# Patient Record
Sex: Female | Born: 1985 | Race: White | Hispanic: No | Marital: Married
Health system: Midwestern US, Community
[De-identification: ages and names within clinical notes are randomized; demographics above are authoritative.]

## PROBLEM LIST (undated history)

## (undated) DIAGNOSIS — I10 Essential (primary) hypertension: Secondary | ICD-10-CM

## (undated) DIAGNOSIS — R748 Abnormal levels of other serum enzymes: Secondary | ICD-10-CM

---

## 2012-03-30 LAB — HM PAP SMEAR

## 2012-08-08 NOTE — Progress Notes (Signed)
[facility]  Sadie Haber, M.D.  [address]  [place], [place] [address]  Office : [phone]  Fax : [phone]  08/08/2012    HISTORY OF PRESENT ILLNESS  Jodi Lamb is a 26 y.o. female.  CC:  Passing out  HPI:  New patient, accompanied by husband  Felt dizzy initially on the plane flight from [place] (returning from honeymoon) on 08/06/12.  Half way through the flight, was sleeping, her husband woke her up when the drink cart came by, she felt "bad" and had a syncopal episode, was out about 30 seconds.  She had one margarita and 1 glass of water prior to the flight (normally doesn't drink alcohol.  She's had syncopal episodes in the past, but this seemed a little different.  Last syncopal episode was 2007.  Since this latest episode, she's felt intermittent dizziness, nausea.  She's had headache for several months - since moving from [place]  Has had episodes where she felt like she couldn't breathe, "almost like having a panic attack".  She reports nothing to be stressed about (other than recent wedding)    Review of Systems   Constitutional: Positive for malaise/fatigue. Negative for fever and chills.   HENT: Negative for sore throat and tinnitus.    Eyes: Negative for blurred vision and double vision.   Respiratory: Positive for shortness of breath. Negative for cough.    Cardiovascular: Positive for palpitations. Negative for chest pain.   Gastrointestinal: Positive for nausea and diarrhea (loose stools). Negative for vomiting.   Genitourinary: Negative for hematuria. Frequency: yesterday - did drink a lot of water.   Musculoskeletal: Positive for myalgias (back of arms bilaterally). Negative for joint pain.   Skin: Positive for itching (back of neck).   Neurological: Positive for dizziness.   Endo/Heme/Allergies: Bruises/bleeds easily.   Psychiatric/Behavioral: Negative for depression.         No Known Allergies    History reviewed. No pertinent past medical history.    Past Surgical  History   Procedure Laterality Date   ??? Hx heent       oral - teeth        History     Social History   ??? Marital Status: MARRIED     Spouse Name: N/A     Number of Children: N/A   ??? Years of Education: N/A     Occupational History   ??? video journalist      [employer]     Social History Main Topics   ??? Smoking status: Never Smoker    ??? Smokeless tobacco: Never Used   ??? Alcohol Use: Yes      Comment: rare   ??? Drug Use: Not on file   ??? Sexually Active: Not on file     Other Topics Concern   ??? Not on file     Social History Narrative   ??? No narrative on file       Family Status   Relation Status Death Age   ??? Mother Alive    ??? Father Alive      Family History   Problem Relation Age of Onset   ??? Other Mother      fibromyalgia   ??? Diabetes Maternal Aunt    ??? Diabetes Maternal Uncle    ??? Diabetes Maternal Grandmother    ??? Cancer Paternal Grandmother      breast   ??? Diabetes Paternal Grandfather    ??? Hypertension Paternal Actor  Visit Vitals   Item Reading   ??? BP 112/62   ??? Pulse 72   ??? Temp(Src) 98.1 ??F (36.7 ??C) (Oral)   ??? Ht 5' 7.75" (1.721 m)   ??? Wt 256 lb (116.121 kg)   ??? BMI 39.21 kg/m2           Physical Exam   Constitutional: She appears well-developed and well-nourished.   HENT:   Right Ear: Hearing and tympanic membrane normal.   Left Ear: Hearing and tympanic membrane normal.   Nose: Nose normal.   Mouth/Throat: Oropharynx is clear and moist and mucous membranes are normal.   Eyes: EOM are normal. Pupils are equal, round, and reactive to light.   Neck: Neck supple. No mass and no thyromegaly present.   Cardiovascular: Normal rate, regular rhythm and normal heart sounds.    Pulses:       Radial pulses are 2+ on the right side, and 2+ on the left side.        Posterior tibial pulses are 2+ on the right side, and 2+ on the left side.   Peripheral - no edema    Pulmonary/Chest: Effort normal and breath sounds normal.   Abdominal: Soft. Bowel sounds are normal. There is no hepatosplenomegaly. There is no  tenderness.   Musculoskeletal:        Thoracic back: She exhibits no tenderness and no bony tenderness.        Lumbar back: She exhibits no tenderness and no bony tenderness.   GAIT - normal   No joint swelling in RUE, LUE, RLE, or LLE   Lymphadenopathy:     She has no cervical adenopathy.        Right: No supraclavicular adenopathy present.        Left: No supraclavicular adenopathy present.   Neurological: She is alert. No cranial nerve deficit. Gait normal.   Reflex Scores:       Bicep reflexes are 1+ on the right side and 1+ on the left side.       Brachioradialis reflexes are 1+ on the right side and 1+ on the left side.       Patellar reflexes are 2+ on the right side and 2+ on the left side.  Skin: Skin is warm and dry.   Psychiatric: She has a normal mood and affect. Her speech is normal and behavior is normal.        ASSESSMENT and PLAN  #1 Syncope 780.2. New problem, stable.  EKG shows normal sinus rhythm, rate 65.  Normal axis.  Normal PR and QTc intervals.  May have been vagal/anxiety/relative dehydration from alcohol and lack of fluids.  Check CMP, CBC  #2 - Fatigue 780.79. New problem, stable.  Checking CMP, CBC, sed rate, TSH  #3 - Dizziness 780.4. New problem, stable.  She declines rx for Antivert  Follow up pending lab results, consider SSRI like Sertraline if labs are ok

## 2012-08-09 ENCOUNTER — Encounter

## 2012-08-09 LAB — CBC WITH AUTOMATED DIFF
ABS. BASOPHILS: 0 10*3/uL (ref 0.0–0.2)
ABS. EOSINOPHILS: 0.2 10*3/uL (ref 0.0–0.4)
ABS. IMM. GRANS.: 0 10*3/uL (ref 0.0–0.1)
ABS. MONOCYTES: 0.8 10*3/uL (ref 0.1–0.9)
ABS. NEUTROPHILS: 6.1 10*3/uL (ref 1.4–7.0)
Abs Lymphocytes: 2.8 10*3/uL (ref 0.7–3.1)
BASOPHILS: 0 % (ref 0–3)
EOSINOPHILS: 2 % (ref 0–5)
HCT: 41.6 % (ref 34.0–46.6)
HGB: 13.3 g/dL (ref 11.1–15.9)
IMMATURE GRANULOCYTES: 0 % (ref 0–2)
Lymphocytes: 28 % (ref 14–46)
MCH: 27.8 pg (ref 26.6–33.0)
MCHC: 32 g/dL (ref 31.5–35.7)
MCV: 87 fL (ref 79–97)
MONOCYTES: 8 % (ref 4–12)
NEUTROPHILS: 62 % (ref 40–74)
PLATELET: 328 10*3/uL (ref 155–379)
RBC: 4.79 x10E6/uL (ref 3.77–5.28)
RDW: 13.9 % (ref 12.3–15.4)
WBC: 10 10*3/uL (ref 3.4–10.8)

## 2012-08-09 LAB — METABOLIC PANEL, COMPREHENSIVE
A-G Ratio: 1.9 (ref 1.1–2.5)
ALT (SGPT): 63 IU/L — ABNORMAL HIGH (ref 0–32)
AST (SGOT): 43 IU/L — ABNORMAL HIGH (ref 0–40)
Albumin: 4.5 g/dL (ref 3.5–5.5)
Alk. phosphatase: 65 IU/L (ref 42–107)
BUN/Creatinine ratio: 24 — ABNORMAL HIGH (ref 8–20)
BUN: 20 mg/dL (ref 6–20)
Bilirubin, total: 0.3 mg/dL (ref 0.0–1.2)
CO2: 22 mmol/L (ref 19–28)
Calcium: 9.5 mg/dL (ref 8.7–10.2)
Chloride: 102 mmol/L (ref 97–108)
Creatinine: 0.84 mg/dL (ref 0.57–1.00)
GFR est AA: 111 mL/min/{1.73_m2} (ref >59)
GFR est non-AA: 96 mL/min/{1.73_m2} (ref >59)
GLOBULIN, TOTAL: 2.4 g/dL (ref 1.5–4.5)
Glucose: 84 mg/dL (ref 65–99)
Potassium: 4.9 mmol/L (ref 3.5–5.2)
Protein, total: 6.9 g/dL (ref 6.0–8.5)
Sodium: 142 mmol/L (ref 134–144)

## 2012-08-09 LAB — TSH 3RD GENERATION: TSH: 2.23 u[IU]/mL (ref 0.450–4.500)

## 2012-08-09 LAB — SED RATE (ESR): Sed rate (ESR): 6 mm/hr (ref 0–32)

## 2012-08-10 LAB — SPECIMEN STATUS REPORT

## 2012-08-11 ENCOUNTER — Encounter

## 2012-08-11 LAB — HEPATITIS C ANTIBODY: HCV Ab: <0.1 s/co ratio (ref 0.0–0.9)

## 2012-08-11 LAB — HEPATITIS C AB: HEP C VIRUS AB: <0.1 s/co ratio (ref 0.0–0.9)

## 2012-08-11 LAB — HEP B SURFACE AG: Hep B surface Ag screen: NEGATIVE

## 2012-08-11 NOTE — Progress Notes (Signed)
Quick Note:    Patient notified of abnormal results and instruction given.  ______

## 2012-08-11 NOTE — Progress Notes (Signed)
Quick Note:    Thyroid (TSH) is ok  Metabolic panel - sugar (glucose), kidney, and electrolytes ok. 2 liver tests (AST and ALT) were both mildly elevated. Because of that, Hepatitis B and C tests were done, which were negative.  Sed rate (for inflammation) is ok  CBC (blood count) is ok    Elevated liver tests may have been incidental or related to recent alcohol. Recommend making a lab appointment to repeat liver tests in 2 weeks    ______

## 2012-08-30 NOTE — Progress Notes (Signed)
[facility]  Sadie Haber, M.D.  [address]  [place], [place] [address]  Office : [phone]  Fax : [phone]  08/30/2012    HISTORY OF PRESENT ILLNESS  Jodi Lamb is a 26 y.o. female.  CC:  Recheck on fainting spell  HPI:  In for a recheck.  She has not had any further syncopal episodes since her last visit here on 08/08/12,  however she has episodes where she feels she could pass out.  She reports previous syncopal episodes (between ages 62 and 34) were all related to getting dehydrated/not drinking enough fluids.  Syncopal episode in 2007 was when a boyfriend told her he got another girl pregnant.  This last syncopal episode occurred while flying back from her honeymoon (see last office visit).  Last week she had feeling she could pass out while [place].  In the last 6 months she has had episodes where she feels she may pass out.  She can feel that sensation coming on and she has about a minute to steady herself.  Other than the episode on the plane trip, she hasn't actually had a syncopal episode.  She does feel a lot of stress with her job and recently having gotten married.  She is also in to recheck liver tests - AST/ALT were mildly elevated on lab work last visit    Review of Systems   Respiratory: Negative for shortness of breath.    Cardiovascular: Negative for palpitations.   Gastrointestinal: Negative for nausea and abdominal pain.        No jaundice         No Known Allergies        Past Surgical History   Procedure Laterality Date   ??? Hx heent       oral - teeth        History     Social History   ??? Marital Status: MARRIED     Spouse Name: N/A     Number of Children: N/A   ??? Years of Education: N/A     Occupational History   ??? video journalist      [employer]     Social History Main Topics   ??? Smoking status: Never Smoker    ??? Smokeless tobacco: Never Used   ??? Alcohol Use: Yes      Comment: rare   ??? Drug Use: Not on file   ??? Sexually Active: Not on file     Other Topics  Concern   ??? Not on file     Social History Narrative   ??? No narrative on file       Family Status   Relation Status Death Age   ??? Mother Alive    ??? Father Alive      Family History   Problem Relation Age of Onset   ??? Other Mother      fibromyalgia   ??? Diabetes Maternal Aunt    ??? Diabetes Maternal Uncle    ??? Diabetes Maternal Grandmother    ??? Cancer Paternal Grandmother      breast   ??? Diabetes Paternal Grandfather    ??? Hypertension Paternal Grandfather        Visit Vitals   Item Reading   ??? BP 130/78   ??? Pulse 84   ??? Temp(Src) 98.1 ??F (36.7 ??C) (Oral)   ??? Ht 5' 7.75" (1.721 m)   ??? Wt 254 lb (115.214 kg)   ???  BMI 38.9 kg/m2           Physical Exam   Constitutional: She appears well-developed and well-nourished.   Neck: Neck supple. Carotid bruit is not present. No mass and no thyromegaly present.   Cardiovascular: Normal rate, regular rhythm and normal heart sounds.    Pulses:       Carotid pulses are 2+ on the right side, and 2+ on the left side.       Posterior tibial pulses are 2+ on the right side, and 2+ on the left side.   Peripheral - no edema    Pulmonary/Chest: Breath sounds normal.   Abdominal: Soft. Bowel sounds are normal. There is no hepatosplenomegaly. There is no tenderness.   Neurological: She is alert. Gait normal.   Reflex Scores:       Bicep reflexes are 1+ on the right side and 1+ on the left side.       Brachioradialis reflexes are 1+ on the right side and 1+ on the left side.       Patellar reflexes are 2+ on the right side and 2+ on the left side.  Psychiatric: She has a normal mood and affect. Her speech is normal and behavior is normal.       ASSESSMENT and PLAN  #1 - Syncope 780.2.Est problem, stable. No further episodes but some near-syncope.  TSH, CMP, CBC, EKG all ok from last time (except mild increase in AST/ALT).  Could be related to anxiety/stress, we discussed SSRI/serotonin  #2 - Elevated liver enzymes 790.4. New problem, stable. Hep B/C were negative.  Will repeat liver tests - if  still elevated, would proceed with ultrasound  Follow up pending lab results.  Did give rx for Sertraline 50mg  daily - may consider starting that, half tablet daily, if liver tests are ok

## 2012-08-31 LAB — HEPATIC FUNCTION PANEL
ALT (SGPT): 19 IU/L (ref 0–32)
AST (SGOT): 17 IU/L (ref 0–40)
Albumin: 4.7 g/dL (ref 3.5–5.5)
Alk. phosphatase: 63 IU/L (ref 39–117)
Bilirubin, direct: 0.09 mg/dL (ref 0.00–0.40)
Bilirubin, total: 0.2 mg/dL (ref 0.0–1.2)
Protein, total: 7.4 g/dL (ref 6.0–8.5)

## 2012-09-01 NOTE — Progress Notes (Signed)
Quick Note:    Patient notifed of normal results.  ______

## 2012-09-01 NOTE — Progress Notes (Signed)
Quick Note:    Liver tests are normal     May want to try Sertraline - take just half a tablet daily. If you start it, then recheck here in 1 month.    ______

## 2013-08-17 NOTE — Telephone Encounter (Signed)
Pt has annual appt w/ [facility] on 12/3. Refills on her Natazia sent to [facility].   (pt has a new nameJohanne Lamb)

## 2013-09-13 LAB — AMB POC HEMOGLOBIN (HGB): Hemoglobin (POC): 14.5

## 2013-09-13 NOTE — Progress Notes (Addendum)
Name: Jodi Lamb  Date: 09/13/2013  Date of Birth: January 22, 1986  LMP: Patient's last menstrual period was 08/30/2013.      Mammogram/Pap Hx 09/13/2013   Pap Date 03/30/2012   Pap Result WNL       Jodi Lamb is a 27 y.o. G0  who is here for an annual exam, no complaints    Review of Systems   Constitutional: Negative for fever, chills, weight loss, malaise/fatigue and diaphoresis.   HENT: Negative for nosebleeds.    Eyes: Negative for blurred vision.   Cardiovascular: Negative for chest pain, palpitations and leg swelling.   Gastrointestinal: Negative for nausea, vomiting, abdominal pain, diarrhea, constipation, blood in stool and melena.   Genitourinary: Negative for dysuria, urgency, frequency and hematuria.   Skin: Negative for itching and rash.   Neurological: Negative for dizziness, speech change, weakness and headaches.   Endo/Heme/Allergies: Bruises/bleeds easily.   Psychiatric/Behavioral: Negative for depression, suicidal ideas, hallucinations, memory loss and substance abuse. The patient is not nervous/anxious and does not have insomnia.    Pt c/o weight gain and bleeding gums      GYN HISTORY 09/13/2013   Current form of contraception OCP's   Previous forms of contracpetion Condoms   Menarche 10   Period cycle 28 - 30   Period duration in days 7   Period pattern Regular   Menstrual flow Moderate   Period control Panty liner;Thin pad;Tampon regular;Tampon super   Frequency of change for flow control 3 x d   Dysmenorrhea None   Dyspareunia None   Post-coital bleeding Small   Pap Date 03/30/2012   Pap Results WNL   STD History HPV       Past Medical History:  has a past medical history of Elevated liver enzymes (Oct 2013); Anxiety; and 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    Past Surgical History:  has past surgical history that includes heent.    Ob History:none    Sexual History:  reports that she currently engages in sexual activity. She reports using the following method of birth control/protection: Pill.     Family History: family history includes Breast Cancer in her maternal grandmother; Diabetes in her maternal aunt, maternal grandmother, maternal uncle, and paternal grandfather; Hypertension in her paternal grandfather; and Other in her mother.  There is no history of Colon Cancer and Ovarian Cancer.    Social History:  reports that she has never smoked. She has never used smokeless tobacco. She reports that  drinks alcohol. She reports that she does not use illicit drugs.    Current Outpatient Prescriptions   Medication Sig   ??? Estradiol Valerate-Dienogest (NATAZIA) 3 mg/2 mg-2 mg/ 2 mg-3 mg/1 mg tab Take 1 tablet by mouth daily.   ??? sertraline (ZOLOFT) 50 mg tablet Take 1 Tab by mouth daily.         Vitals:  height is 5' 7.25" (1.708 m) and weight is 290 lb (131.543 kg). Her blood pressure is 150/100.  Body mass index is 45.09 kg/(m^2).  Hgb: 14.5    Physical Exam   Constitutional: She is oriented to person, place, and time. She appears well-developed and well-nourished.   Eyes: No scleral icterus.   Neck: No tracheal deviation present. No thyromegaly present.   Pulmonary/Chest: Effort normal.   Breast without tenderness, masses, or discharge   Abdominal: Soft. She exhibits no distension and no mass. There is no splenomegaly or hepatomegaly. There is no tenderness. There is no rebound and no guarding.  Genitourinary: No vaginal discharge found.   External genitalia with in normal limits including urethral meatus.  Vagina pink and rugated.  Bimanual exam reveals the bladder to have good support and be in the proper position.  Cervix is present and without lesions. Uterus is present, small and smooth.  Adnexa present and within normal limits.   Lymphadenopathy:     She has no cervical adenopathy.     She has no axillary adenopathy.   Neurological: She is alert and oriented to person, place, and time.         Assessment: 27 y.o. , G0 P0000, here for annual exam  1. Health maintenance: pap smear is preformed, self  breast exam encouraged, mammogram is not ordered.  2Pregnancy Prevention: cont ocp.  May consider kids at 30  3. Htn: recheck over next 2 weeks then appt    Roney Marion, MD

## 2013-09-19 LAB — PAP, IG, RFX HPV ASCUS (507301)
.: 0
LABCORP 019018: 0

## 2013-09-28 NOTE — Progress Notes (Signed)
Name: Jodi Lamb  Date: 09/28/2013  Date of Birth: 26-Aug-1986  LMP: Patient's last menstrual period was 08/28/2013.    Mammogram/Pap Hx 09/28/2013 09/13/2013   Pap Date 09/13/2013 03/30/2012   Pap Result WNL WNL       Jodi Lamb is a 27 y.o. G0 P0000 who is here for had a chief complaint of Follow-up. BP check. When she left last time she gott serious, hired a Psychologist, educational and is commitetd to getting in shape     Review of Systems   Constitutional: Negative for fever, chills, weight loss, malaise/fatigue and diaphoresis.   HENT: Negative for nosebleeds.    Eyes: Negative for blurred vision.   Cardiovascular: Negative for chest pain, palpitations and leg swelling.   Gastrointestinal: Negative for nausea, vomiting, abdominal pain, diarrhea, constipation, blood in stool and melena.   Genitourinary: Negative for dysuria, urgency, frequency and hematuria.   Skin: Negative for itching and rash.   Neurological: Negative for dizziness, speech change, weakness and headaches.   Endo/Heme/Allergies: Does not bruise/bleed easily.   Psychiatric/Behavioral: Negative for depression, suicidal ideas, hallucinations, memory loss and substance abuse. The patient is not nervous/anxious and does not have insomnia.           GYN HISTORY 09/28/2013 09/13/2013   Current form of contraception - OCP's   Previous forms of contracpetion - Condoms   Menarche - 10   Period cycle - 28 - 30   Period duration in days - 7   Period pattern - Regular   Menstrual flow - Moderate   Period control - Panty liner;Thin pad;Tampon regular;Tampon super   Frequency of change for flow control - 3 x d   Dysmenorrhea - None   Dyspareunia - None   Post-coital bleeding - Small   Pap Date 09/13/2013 03/30/2012   Pap Results WNL WNL   STD History - HPV         Past Medical History:  has a past medical history of Elevated liver enzymes (Oct 2013); Anxiety; and 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    Past Surgical History:  has past surgical history that includes heent.    OB  History:none    Sexual History:  reports that she currently engages in sexual activity. She reports using the following method of birth control/protection: Pill.    Family History: family history includes Breast Cancer in her maternal grandmother; Diabetes in her maternal aunt, maternal grandmother, maternal uncle, and paternal grandfather; Hypertension in her paternal grandfather; and Other in her mother.  There is no history of Colon Cancer and Ovarian Cancer.    Social History:  reports that she has never smoked. She has never used smokeless tobacco. She reports that  drinks alcohol. She reports that she does not use illicit drugs.    Current Outpatient Prescriptions   Medication Sig   ??? Estradiol Valerate-Dienogest (NATAZIA) 3 mg/2 mg-2 mg/ 2 mg-3 mg/1 mg tab Take 1 tablet by mouth daily.   ??? sertraline (ZOLOFT) 50 mg tablet Take 1 Tab by mouth daily.       Allergies: has No Known Allergies.    Vital signs:  weight is 268 lb (121.564 kg). Her blood pressure is 118/86.  Body mass index is 41.67 kg/(m^2).     Physical Exam   Constitutional: She is oriented to person, place, and time. She appears well-developed and well-nourished.   HENT:   Head: Normocephalic and atraumatic.   Eyes: No scleral icterus.   Pulmonary/Chest: Effort normal.  Neurological: She is alert and oriented to person, place, and time.   Psychiatric: Judgment and thought content normal.         Assessment: 27 y.o. , G0 P0000, here for: bp check, she brought in 2 weeks worth of bps.  They run 130s/80s.  Will cont ocp. She will cont health improvement                  Roney Marion, MD

## 2014-02-14 MED ORDER — NORGESTIMATE-ETHINYL ESTRADIOL 0.25 MG-35 MCG TAB
PACK | Freq: Every day | ORAL | Status: DC
Start: 2014-02-14 — End: 2014-09-18

## 2014-02-14 NOTE — Telephone Encounter (Signed)
Pt is currently on Natazia ocp's. C/o menses lasting 10 days q month. Requests to change pills. Per [facility], okay to change to Orthocyclen.

## 2014-09-19 MED ORDER — MONONESSA (28) 0.25 MG-35 MCG TABLET
ORAL_TABLET | ORAL | Status: AC
Start: 2014-09-19 — End: ?

## 2015-07-10 ENCOUNTER — Encounter: Payer: Self-pay | Admitting: Pediatrics

## 2015-07-10 ENCOUNTER — Ambulatory Visit (INDEPENDENT_AMBULATORY_CARE_PROVIDER_SITE_OTHER): Payer: 59 | Admitting: Pediatrics

## 2015-07-10 VITALS — BP 149/104 | HR 93 | Temp 98.0°F | Ht 68.0 in | Wt 289.2 lb

## 2015-07-10 DIAGNOSIS — R635 Abnormal weight gain: Secondary | ICD-10-CM

## 2015-07-10 DIAGNOSIS — M7989 Other specified soft tissue disorders: Secondary | ICD-10-CM | POA: Diagnosis not present

## 2015-07-10 DIAGNOSIS — Z6841 Body Mass Index (BMI) 40.0 and over, adult: Secondary | ICD-10-CM

## 2015-07-10 DIAGNOSIS — R55 Syncope and collapse: Secondary | ICD-10-CM | POA: Diagnosis not present

## 2015-07-10 DIAGNOSIS — R030 Elevated blood-pressure reading, without diagnosis of hypertension: Secondary | ICD-10-CM

## 2015-07-10 DIAGNOSIS — IMO0001 Reserved for inherently not codable concepts without codable children: Secondary | ICD-10-CM | POA: Insufficient documentation

## 2015-07-10 LAB — POCT URINALYSIS DIPSTICK
BILIRUBIN UA: NEGATIVE
GLUCOSE UA: NEGATIVE
Ketones, UA: NEGATIVE
Nitrite, UA: NEGATIVE
PH UA: 5
Protein, UA: NEGATIVE
SPEC GRAV UA: 1.015
UROBILINOGEN UA: NEGATIVE

## 2015-07-10 NOTE — Patient Instructions (Signed)
Vasovagal Syncope, Adult °Syncope, commonly known as fainting, is a temporary loss of consciousness. It occurs when the blood flow to the brain is reduced. Vasovagal syncope (also called neurocardiogenic syncope) is a fainting spell in which the blood flow to the brain is reduced because of a sudden drop in heart rate and blood pressure. Vasovagal syncope occurs when the brain and the cardiovascular system (blood vessels) do not adequately communicate and respond to each other. This is the most common cause of fainting. It often occurs in response to fear or some other type of emotional or physical stress. The body has a reaction in which the heart starts beating too slowly or the blood vessels expand, reducing blood pressure. This type of fainting spell is generally considered harmless. However, injuries can occur if a person takes a sudden fall during a fainting spell.  °CAUSES  °Vasovagal syncope occurs when a person's blood pressure and heart rate decrease suddenly, usually in response to a trigger. Many things and situations can trigger an episode. Some of these include:  °· Pain.   °· Fear.   °· The sight of blood or medical procedures, such as blood being drawn from a vein.   °· Common activities, such as coughing, swallowing, stretching, or going to the bathroom.   °· Emotional stress.   °· Prolonged standing, especially in a warm environment.   °· Lack of sleep or rest.   °· Prolonged lack of food.   °· Prolonged lack of fluids.   °· Recent illness. °· The use of certain drugs that affect blood pressure, such as cocaine, alcohol, marijuana, inhalants, and opiates.   °SYMPTOMS  °Before the fainting episode, you may:  °· Feel dizzy or light headed.   °· Become pale. °· Sense that you are going to faint.   °· Feel like the room is spinning.   °· Have tunnel vision, only seeing directly in front of you.   °· Feel sick to your stomach (nauseous).   °· See spots or slowly lose vision.   °· Hear ringing in your  ears.   °· Have a headache.   °· Feel warm and sweaty.   °· Feel a sensation of pins and needles. °During the fainting spell, you will generally be unconscious for no longer than a couple minutes before waking up and returning to normal. If you get up too quickly before your body can recover, you may faint again. Some twitching or jerky movements may occur during the fainting spell.  °DIAGNOSIS  °Your caregiver will ask about your symptoms, take a medical history, and perform a physical exam. Various tests may be done to rule out other causes of fainting. These may include blood tests and tests to check the heart, such as electrocardiography, echocardiography, and possibly an electrophysiology study. When other causes have been ruled out, a test may be done to check the body's response to changes in position (tilt table test). °TREATMENT  °Most cases of vasovagal syncope do not require treatment. Your caregiver may recommend ways to avoid fainting triggers and may provide home strategies for preventing fainting. If you must be exposed to a possible trigger, you can drink additional fluids to help reduce your chances of having an episode of vasovagal syncope. If you have warning signs of an oncoming episode, you can respond by positioning yourself favorably (lying down). °If your fainting spells continue, you may be given medicines to prevent fainting. Some medicines may help make you more resistant to repeated episodes of vasovagal syncope. Special exercises or compression stockings may be recommended. In rare cases, the surgical placement   of a pacemaker is considered. °HOME CARE INSTRUCTIONS  °· Learn to identify the warning signs of vasovagal syncope.   °· Sit or lie down at the first warning sign of a fainting spell. If sitting, put your head down between your legs. If you lie down, swing your legs up in the air to increase blood flow to the brain.   °· Avoid hot tubs and saunas. °· Avoid prolonged  standing. °· Drink enough fluids to keep your urine clear or pale yellow. Avoid caffeine. °· Increase salt in your diet as directed by your caregiver.   °· If you have to stand for a long time, perform movements such as:   °¨ Crossing your legs.   °¨ Flexing and stretching your leg muscles.   °¨ Squatting.   °¨ Moving your legs.   °¨ Bending over.   °· Only take over-the-counter or prescription medicines as directed by your caregiver. Do not suddenly stop any medicines without asking your caregiver first.  °SEEK MEDICAL CARE IF:  °· Your fainting spells continue or happen more frequently in spite of treatment.   °· You lose consciousness for more than a couple minutes. °· You have fainting spells during or after exercising or after being startled.   °· You have new symptoms that occur with the fainting spells, such as:   °¨ Shortness of breath. °¨ Chest pain.   °¨ Irregular heartbeat.   °· You have episodes of twitching or jerky movements that last longer than a few seconds. °· You have episodes of twitching or jerky movements without obvious fainting. °SEEK IMMEDIATE MEDICAL CARE IF:  °· You have injuries or bleeding after a fainting spell.   °· You have episodes of twitching or jerky movements that last longer than 5 minutes.   °· You have more than one spell of twitching or jerky movements before returning to consciousness after fainting. °MAKE SURE YOU:  °· Understand these instructions. °· Will watch your condition. °· Will get help right away if you are not doing well or get worse. °Document Released: 09/14/2012 Document Reviewed: 09/14/2012 °[employer]® Patient Information ©2015 [employer]. This information is not intended to replace advice given to you by your health care provider. Make sure you discuss any questions you have with your health care provider. ° °
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
## 2015-07-11 LAB — CBC
HEMOGLOBIN: 13.4 g/dL (ref 11.1–15.9)
Hematocrit: 40.5 % (ref 34.0–46.6)
MCH: 27.5 pg (ref 26.6–33.0)
MCHC: 33.1 g/dL (ref 31.5–35.7)
MCV: 83 fL (ref 79–97)
PLATELETS: 382 10*3/uL — AB (ref 150–379)
RBC: 4.87 x10E6/uL (ref 3.77–5.28)
RDW: 14.4 % (ref 12.3–15.4)
WBC: 11.2 10*3/uL — AB (ref 3.4–10.8)

## 2015-07-11 LAB — BMP8+EGFR
BUN/Creatinine Ratio: 20 (ref 8–20)
BUN: 15 mg/dL (ref 6–20)
CALCIUM: 9.8 mg/dL (ref 8.7–10.2)
CHLORIDE: 98 mmol/L (ref 97–108)
CO2: 22 mmol/L (ref 18–29)
Creatinine, Ser: 0.76 mg/dL (ref 0.57–1.00)
GFR calc Af Amer: 123 mL/min/{1.73_m2} (ref >59)
GFR, EST NON AFRICAN AMERICAN: 106 mL/min/{1.73_m2} (ref >59)
GLUCOSE: 93 mg/dL (ref 65–99)
POTASSIUM: 4.8 mmol/L (ref 3.5–5.2)
Sodium: 139 mmol/L (ref 134–144)

## 2015-07-11 LAB — LIPID PANEL
CHOLESTEROL TOTAL: 162 mg/dL (ref 100–199)
Chol/HDL Ratio: 2.6 ratio units (ref 0.0–4.4)
HDL: 62 mg/dL (ref >39)
LDL CALC: 86 mg/dL (ref 0–99)
TRIGLYCERIDES: 71 mg/dL (ref 0–149)
VLDL CHOLESTEROL CAL: 14 mg/dL (ref 5–40)

## 2015-07-11 LAB — TSH: TSH: 1.78 u[IU]/mL (ref 0.450–4.500)

## 2015-07-31 ENCOUNTER — Ambulatory Visit: Payer: 59 | Admitting: Pediatrics

## 2015-09-16 ENCOUNTER — Telehealth: Payer: Self-pay | Admitting: Pediatrics

## 2015-09-16 NOTE — Telephone Encounter (Signed)
Does not want flu shot °

## 2015-10-03 ENCOUNTER — Encounter (HOSPITAL_COMMUNITY): Payer: Self-pay | Admitting: Emergency Medicine

## 2015-10-03 ENCOUNTER — Emergency Department (HOSPITAL_COMMUNITY): Payer: 59

## 2015-10-03 ENCOUNTER — Emergency Department (HOSPITAL_COMMUNITY)
Admission: EM | Admit: 2015-10-03 | Discharge: 2015-10-04 | Disposition: A | Discharge status: Home / Self Care | Payer: 59 | Attending: Emergency Medicine | Admitting: Emergency Medicine

## 2015-10-03 DIAGNOSIS — S79912A Unspecified injury of left hip, initial encounter: Secondary | ICD-10-CM | POA: Diagnosis not present

## 2015-10-03 DIAGNOSIS — Y9289 Other specified places as the place of occurrence of the external cause: Secondary | ICD-10-CM | POA: Diagnosis not present

## 2015-10-03 DIAGNOSIS — S199XXA Unspecified injury of neck, initial encounter: Secondary | ICD-10-CM | POA: Diagnosis present

## 2015-10-03 DIAGNOSIS — Y9389 Activity, other specified: Secondary | ICD-10-CM | POA: Insufficient documentation

## 2015-10-03 DIAGNOSIS — T07XXXA Unspecified multiple injuries, initial encounter: Secondary | ICD-10-CM

## 2015-10-03 DIAGNOSIS — S60512A Abrasion of left hand, initial encounter: Secondary | ICD-10-CM | POA: Diagnosis not present

## 2015-10-03 DIAGNOSIS — S3992XA Unspecified injury of lower back, initial encounter: Secondary | ICD-10-CM | POA: Diagnosis not present

## 2015-10-03 DIAGNOSIS — T148 Other injury of unspecified body region: Secondary | ICD-10-CM | POA: Diagnosis not present

## 2015-10-03 DIAGNOSIS — Y998 Other external cause status: Secondary | ICD-10-CM | POA: Diagnosis not present

## 2015-10-03 DIAGNOSIS — S60511A Abrasion of right hand, initial encounter: Secondary | ICD-10-CM | POA: Insufficient documentation

## 2015-10-03 LAB — I-STAT CHEM 8, ED
BUN: 17 mg/dL (ref 6–20)
CALCIUM ION: 1.14 mmol/L (ref 1.12–1.23)
Chloride: 104 mmol/L (ref 101–111)
Creatinine, Ser: 0.7 mg/dL (ref 0.44–1.00)
GLUCOSE: 145 mg/dL — AB (ref 65–99)
HCT: 41 % (ref 36.0–46.0)
HEMOGLOBIN: 13.9 g/dL (ref 12.0–15.0)
Potassium: 3.9 mmol/L (ref 3.5–5.1)
Sodium: 140 mmol/L (ref 135–145)
TCO2: 25 mmol/L (ref 0–100)

## 2015-10-03 LAB — I-STAT BETA HCG BLOOD, ED (MC, WL, AP ONLY)

## 2015-10-03 MED ORDER — FENTANYL CITRATE (PF) 100 MCG/2ML IJ SOLN
50.0000 ug | Freq: Once | INTRAMUSCULAR | Status: AC
  Administered 2015-10-03: 50 ug via INTRAVENOUS
  Filled 2015-10-03: qty 2

## 2015-10-03 MED ORDER — TETANUS-DIPHTH-ACELL PERTUSSIS 5-2.5-18.5 LF-MCG/0.5 IM SUSP
0.5000 mL | Freq: Once | INTRAMUSCULAR | Status: AC
  Administered 2015-10-03: 0.5 mL via INTRAMUSCULAR
  Filled 2015-10-03: qty 0.5

## 2015-10-03 MED ORDER — SODIUM CHLORIDE 0.9 % IV BOLUS (SEPSIS)
1000.0000 mL | Freq: Once | INTRAVENOUS | Status: AC
  Administered 2015-10-03: 1000 mL via INTRAVENOUS

## 2015-10-03 MED ORDER — HYDROCODONE-ACETAMINOPHEN 5-325 MG PO TABS: 1.0000 | ORAL_TABLET | ORAL | Status: DC | PRN

## 2015-10-03 MED ORDER — IBUPROFEN 800 MG PO TABS: 800.0000 mg | ORAL_TABLET | Freq: Three times a day (TID) | ORAL | Status: DC

## 2015-10-03 MED ORDER — IOHEXOL 300 MG/ML  SOLN
100.0000 mL | Freq: Once | INTRAMUSCULAR | Status: AC | PRN
  Administered 2015-10-03: 100 mL via INTRAVENOUS

## 2015-10-03 MED ORDER — TETANUS-DIPHTHERIA TOXOIDS TD 5-2 LFU IM INJ
0.5000 mL | INJECTION | Freq: Once | INTRAMUSCULAR | Status: DC
  Filled 2015-10-03: qty 0.5
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
## 2015-10-03 NOTE — Discharge Instructions (Signed)

## 2015-10-03 NOTE — ED Notes (Signed)
Patient ambulated from room to restroom and back. No problems.
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
## 2015-10-03 NOTE — ED Notes (Signed)
Patient tolerating fluid. 

## 2015-10-03 NOTE — ED Notes (Signed)
Patient states that she is having a very bad headache at this time.

## 2015-10-04 ENCOUNTER — Encounter: Payer: Self-pay | Admitting: Family Medicine

## 2015-10-04 ENCOUNTER — Ambulatory Visit (INDEPENDENT_AMBULATORY_CARE_PROVIDER_SITE_OTHER): Payer: 59 | Admitting: Family Medicine

## 2015-10-04 ENCOUNTER — Emergency Department (HOSPITAL_COMMUNITY)
Admission: EM | Admit: 2015-10-04 | Discharge: 2015-10-04 | Disposition: A | Discharge status: Home / Self Care | Payer: No Typology Code available for payment source | Attending: Emergency Medicine | Admitting: Emergency Medicine

## 2015-10-04 ENCOUNTER — Encounter (HOSPITAL_COMMUNITY): Payer: Self-pay | Admitting: Emergency Medicine

## 2015-10-04 VITALS — BP 140/83 | HR 96 | Temp 99.0°F | Ht 68.0 in | Wt 300.4 lb

## 2015-10-04 DIAGNOSIS — X58XXXA Exposure to other specified factors, initial encounter: Secondary | ICD-10-CM | POA: Diagnosis not present

## 2015-10-04 DIAGNOSIS — T162XXA Foreign body in left ear, initial encounter: Secondary | ICD-10-CM | POA: Diagnosis not present

## 2015-10-04 DIAGNOSIS — Y9289 Other specified places as the place of occurrence of the external cause: Secondary | ICD-10-CM | POA: Diagnosis not present

## 2015-10-04 DIAGNOSIS — Y9389 Activity, other specified: Secondary | ICD-10-CM | POA: Insufficient documentation

## 2015-10-04 DIAGNOSIS — Z791 Long term (current) use of non-steroidal anti-inflammatories (NSAID): Secondary | ICD-10-CM | POA: Insufficient documentation

## 2015-10-04 DIAGNOSIS — Y998 Other external cause status: Secondary | ICD-10-CM | POA: Diagnosis not present

## 2015-10-04 DIAGNOSIS — I10 Essential (primary) hypertension: Secondary | ICD-10-CM | POA: Diagnosis not present

## 2015-10-04 HISTORY — DX: Essential (primary) hypertension: I10

## 2015-10-04 MED ORDER — TETRACAINE HCL 0.5 % OP SOLN
2.0000 [drp] | Freq: Once | OPHTHALMIC | Status: AC
  Administered 2015-10-04: 2 [drp] via OPHTHALMIC
  Filled 2015-10-04: qty 2

## 2015-10-04 MED ORDER — LIDOCAINE-EPINEPHRINE-TETRACAINE (LET) SOLUTION
3.0000 mL | Freq: Once | NASAL | Status: AC
  Administered 2015-10-04: 3 mL via TOPICAL
  Filled 2015-10-04: qty 3

## 2015-10-04 MED ORDER — ANTIPYRINE-BENZOCAINE 5.4-1.4 % OT SOLN
3.0000 [drp] | OTIC | Status: DC | PRN
Start: 2015-10-04 — End: 2015-10-04
  Filled 2015-10-04: qty 15

## 2015-10-04 MED ORDER — CVS EAR PAIN RELIEF OT SOLN: 3.0000 [drp] | OTIC | Status: DC | PRN
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
## 2015-10-04 NOTE — Discharge Instructions (Signed)
Ear Foreign Body °An ear foreign body is an object that is stuck in your ear. The object is usually stuck in the ear canal. °CAUSES °In all ages of people, the most common foreign bodies are insects that enter the ear canal. It is common for young children to put objects into the ear canal. These may include pebbles, beads, parts of toys, and any other small objects that fit into the ear. In adults, objects such as cotton swabs may become lodged in the ear canal.  °SIGNS AND SYMPTOMS °A foreign body in the ear may cause: °· Pain. °· Buzzing or roaring sounds. °· Hearing loss. °· Ear drainage or bleeding. °· Nausea and vomiting. °· A feeling that your ear is full. °DIAGNOSIS °Your health care provider may be able to diagnose an ear foreign body based on the information that you provide, your symptoms, and a physical exam. Your health care provider may also perform tests, such as testing your hearing and your ear pressure, to check for infection or other problems that are caused by the foreign body in your ear. °TREATMENT °Treatment depends on what the foreign body is, the location of the foreign body in your ear, and whether or not the foreign body has injured any part of your inner ear. If the foreign body is visible to your health care provider, it may be possible to remove the foreign body using: °· A tool, such as medical tweezers (forceps) or a suction tube (catheter). °· Irrigation. This uses water to flush the foreign body out of your ear. This is used only if the foreign body is not likely to swell or enlarge when it is put in water. °If the foreign body is not visible or your health care provider was not able to remove the foreign body, you may be referred to a specialist for removal. You may also be prescribed antibiotic medicine or ear drops to prevent infection. If the foreign body has caused injury to other parts of your ear, you may need additional treatment. °HOME CARE INSTRUCTIONS °· Keep all  follow-up visits as directed by your health care provider. This is important. °· Take medicines only as directed by your health care provider. °· If you were prescribed an antibiotic medicine, finish it all even if you start to feel better. °PREVENTION °· Keep small objects out of reach of young children. Tell children not to put anything in their ears. °· Do not put anything in your ear, including cotton swabs, to clean your ears. Talk to your health care provider about how to clean your ears safely. °SEEK MEDICAL CARE IF: °· You have a headache. °· Your have blood coming from your ear. °· You have a fever. °· You have increased pain or swelling of your ear. °· Your hearing is reduced. °· You have discharge coming from your ear. °  °This information is not intended to replace advice given to you by your health care provider. Make sure you discuss any questions you have with your health care provider. °  °Document Released: 09/25/2000 Document Revised: 10/19/2014 Document Reviewed: 05/14/2014 °Elsevier Interactive Patient Education ©2016 [employer] ° °
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
## 2015-10-04 NOTE — ED Notes (Signed)
Pt stable, ambulatory, states understanding of discharge instructions 

## 2015-10-11 ENCOUNTER — Encounter: Payer: Self-pay | Admitting: Family Medicine

## 2015-10-11 ENCOUNTER — Ambulatory Visit (INDEPENDENT_AMBULATORY_CARE_PROVIDER_SITE_OTHER): Payer: 59 | Admitting: Family Medicine

## 2015-10-11 DIAGNOSIS — M545 Low back pain: Secondary | ICD-10-CM

## 2015-10-11 DIAGNOSIS — M791 Myalgia: Secondary | ICD-10-CM

## 2015-10-11 MED ORDER — CYCLOBENZAPRINE HCL 10 MG PO TABS: 10.0000 mg | ORAL_TABLET | Freq: Three times a day (TID) | ORAL | Status: DC | PRN
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
## 2015-10-24 ENCOUNTER — Ambulatory Visit: Payer: 59 | Attending: Family Medicine | Admitting: Physical Therapy

## 2015-10-24 DIAGNOSIS — M542 Cervicalgia: Secondary | ICD-10-CM | POA: Insufficient documentation

## 2015-10-24 DIAGNOSIS — R519 Headache, unspecified: Secondary | ICD-10-CM

## 2015-10-24 DIAGNOSIS — R51 Headache: Secondary | ICD-10-CM | POA: Insufficient documentation

## 2015-10-24 DIAGNOSIS — M546 Pain in thoracic spine: Secondary | ICD-10-CM

## 2015-10-24 NOTE — Therapy (Signed)
[facility] [address] [place], [place], [address] Phone: [phone]   Fax:  [phone]  Physical Therapy Evaluation  Patient Details  Name: Priscilla Beasley MRN: [id number] Date of Birth: 23-Jan-1986 Referring Provider: Ivin BootyJoshua Dettinger MD  Encounter Date: 10/24/2015      PT End of Session - 10/24/15 1554    Visit Number 1   Number of Visits 16   Date for PT Re-Evaluation 12/05/15   PT Start Time 0240   PT Stop Time 0350   PT Time Calculation (min) 70 min   Activity Tolerance Patient tolerated treatment well   Behavior During Therapy [facility] for tasks assessed/performed      Past Medical History  Diagnosis Date  . Hypertension     No past surgical history on file.  There were no vitals filed for this visit.  Visit Diagnosis:  Frequent headaches - Plan: PT plan of care cert/re-cert  Neck pain - Plan: PT plan of care cert/re-cert  Bilateral thoracic back pain - Plan: PT plan of care cert/re-cert      Subjective Assessment - 10/24/15 1447    Subjective Had ' little bit of neck pain" since 2016.  I am also hurting in places I have never hurt before.   Limitations Sitting   How long can you sit comfortably? 15 minutes.   Patient Stated Goals Want to get back   Pain Score 6    Pain Location Back   Pain Orientation Right   Pain Descriptors / Indicators Aching   Pain Type Acute pain            [facility] PT Assessment - 10/24/15 0001    Assessment   Medical Diagnosis Neck pain.   Referring Provider Arville CareJoshua Dettinger MD   Onset Date/Surgical Date --  10/03/15 (date of injury).   Precautions   Precautions None   Restrictions   Weight Bearing Restrictions No   Balance Screen   Has the patient fallen in the past 6 months No   Has the patient had a decrease in activity level because of a fear of falling?  No   Is the patient reluctant to leave their home because of a fear of falling?  No   Home Psychiatristnvironment   Living  Environment Private residence   Prior Function   Level of Independence Independent   Posture/Postural Control   Posture/Postural Control Postural limitations   Postural Limitations Forward head   ROM / Strength   AROM / PROM / Strength AROM;Strength   AROM   Overall AROM Comments Right cervical rotation= 58 degrees and left= 56 degrees and bilateral SB= 15 degrees.   Strength   Overall Strength Comments No UE strength deficits noted.   Palpation   Palpation comment Tender over bilateral suboccipital region and generally over her cervical paraspinal musculature as well.  The patient is also very tender over her right UT (taut to touch as well) and diffusely over her thoracic musculature to level T7-8 bilaterally.   Special Tests    Special Tests --  Normal bilateral UE DTR's.                   [facility] - 10/24/15 0001    Exercises   Exercises Neck   Modalities   Modalities Electrical Stimulation   Electrical Stimulation   Electrical Stimulation Location IFC at 100% scan to affected cervical/thoracic region x 20 minutes.  PT Long Term Goals - 10/24/15 1539    PT LONG TERM GOAL #1   Title Ind with a HEP.   Time 8   Period Weeks   Status New   PT LONG TERM GOAL #2   Title Increase active cervical rotation to 80 degrees+ so patient can turn head more easily while driving.   Time 8   Period Weeks   Status New   PT LONG TERM GOAL #3   Title Eliminate UE symptoms.   Time 8   Period Weeks   Status New   PT LONG TERM GOAL #4   Title Eliminate headaches.   Time 8   Period Weeks   Status New   PT LONG TERM GOAL #5   Title Perform ADL's with pain not > 3/10.   Time 8   Period Weeks   Status New   Additional Long Term Goals   Additional Long Term Goals Yes   PT LONG TERM GOAL #6   Title Sit 30 minutes with pain not > 3/10.   Time 8   Period Weeks   Status New               Plan - 10/24/15  1553    PT Next Visit Plan Moist heat andelectrical stimulation; U/S to right UT and mid-back musculature f/b STW/M.  Progress into cervical ROM exercises; Mckenzie extension; scapular retraction exercise.  Suboccipital release technique.         Problem List Patient Active Problem List   Diagnosis Date Noted  . BMI 40.0-44.9, adult (HCC) 07/10/2015  . Elevated blood pressure 07/10/2015    Jarmel Linhardt, [place] MPT 10/24/2015, 3:58 PM  [facility] [address] [place], [place], [address] Phone: [phone]   Fax:  [phone]  Name: Priscilla Beasley MRN: [id number] Date of Birth: [birth year]

## 2015-10-29 ENCOUNTER — Ambulatory Visit: Payer: 59 | Admitting: *Deleted

## 2015-10-29 DIAGNOSIS — M542 Cervicalgia: Secondary | ICD-10-CM

## 2015-10-29 DIAGNOSIS — R51 Headache: Secondary | ICD-10-CM | POA: Diagnosis not present

## 2015-10-29 DIAGNOSIS — R519 Headache, unspecified: Secondary | ICD-10-CM

## 2015-10-29 DIAGNOSIS — M546 Pain in thoracic spine: Secondary | ICD-10-CM

## 2015-10-29 NOTE — Therapy (Signed)
[facility] [address] [place], [place], [address] Phone: [phone]   Fax:  [phone]  Physical Therapy Treatment  Patient Details  Name: Priscilla Beasley MRN: [id number] Date of Birth: September 18, 1986 Referring Provider: Ivin Booty Dettinger MD  Encounter Date: 10/29/2015      PT End of Session - 10/29/15 1730    Visit Number 2   Number of Visits 16   Date for PT Re-Evaluation 12/05/15   PT Start Time 1645   PT Stop Time 1736   PT Time Calculation (min) 51 min      Past Medical History  Diagnosis Date  . Hypertension     No past surgical history on file.  There were no vitals filed for this visit.  Visit Diagnosis:  Frequent headaches  Neck pain  Bilateral thoracic back pain                       [facility] Adult PT Treatment/Exercise - 10/29/15 0001    Modalities   Modalities Electrical Stimulation;Moist Heat;Ultrasound   Moist Heat Therapy   Number Minutes Moist Heat 15 Minutes   Moist Heat Location Cervical   and mid-back   Electrical Stimulation   Electrical Stimulation Location IFC at 100% scan to affected cervical/thoracic region x 15 minutes.   Ultrasound   Ultrasound Location Combo x 12 mins to RT UT and into mid-trap at 1.5 w/cm2 in sitting   Manual Therapy   Manual Therapy Soft tissue mobilization;Myofascial release   Soft tissue mobilization STW/IASTM  to RT UT, levater scap, and into mid-trap in sitting                     PT Long Term Goals - 10/24/15 1539    PT LONG TERM GOAL #1   Title Ind with a HEP.   Time 8   Period Weeks   Status New   PT LONG TERM GOAL #2   Title Increase active cervical rotation to 80 degrees+ so patient can turn head more easily while driving.   Time 8   Period Weeks   Status New   PT LONG TERM GOAL #3   Title Eliminate UE symptoms.   Time 8   Period Weeks   Status New   PT LONG TERM GOAL #4   Title Eliminate headaches.   Time 8   Period  Weeks   Status New   PT LONG TERM GOAL #5   Title Perform ADL's with pain not > 3/10.   Time 8   Period Weeks   Status New   Additional Long Term Goals   Additional Long Term Goals Yes   PT LONG TERM GOAL #6   Title Sit 30 minutes with pain not > 3/10.   Time 8   Period Weeks   Status New               Plan - 10/29/15 1734    Clinical Impression Statement Pt did fairly well with Rx today. She had notable tightness in RT UT, Levator, and mid-trap. Fairly tender over UT as well during STW. Normal response to modalities today. She has higher pain levels at work due to sitting a lot and has to really monitor her posture   Pt will benefit from skilled therapeutic intervention in order to improve on the following deficits Pain;Decreased range of motion;Decreased activity tolerance   Rehab Potential Excellent   PT Frequency 2x / week  PT Duration 8 weeks   PT Treatment/Interventions Electrical Stimulation;Moist Heat;Therapeutic exercise;Therapeutic activities;Ultrasound;Patient/family education;Manual techniques   PT Next Visit Plan Moist heat andelectrical stimulation; U/S to right UT and mid-back musculature f/b STW/M.  Progress into cervical ROM exercises; Mckenzie extension; scapular retraction exercise.  Suboccipital release technique.   Consulted and Agree with Plan of Care Patient        Problem List Patient Active Problem List   Diagnosis Date Noted  . BMI 40.0-44.9, adult (HCC) 07/10/2015  . Elevated blood pressure 07/10/2015    Priscilla Beasley,CHRIS, PTA 10/29/2015, 5:47 PM  [facility] [address] [place], [place], [address] Phone: [phone]   Fax:  [phone]  Name: Priscilla Beasley MRN: [id number] Date of Birth: Feb 24, 1986

## 2015-10-31 ENCOUNTER — Ambulatory Visit: Payer: 59 | Admitting: *Deleted

## 2015-10-31 DIAGNOSIS — M546 Pain in thoracic spine: Secondary | ICD-10-CM

## 2015-10-31 DIAGNOSIS — R51 Headache: Secondary | ICD-10-CM | POA: Diagnosis not present

## 2015-10-31 DIAGNOSIS — M542 Cervicalgia: Secondary | ICD-10-CM

## 2015-10-31 DIAGNOSIS — R519 Headache, unspecified: Secondary | ICD-10-CM

## 2015-10-31 NOTE — Therapy (Signed)
[facility] [address] [place], [place], [address] Phone: [phone]   Fax:  [phone]  Physical Therapy Treatment  Patient Details  Name: Priscilla Beasley MRN: [id number] Date of Birth: April 09, 1986 Referring Provider: Ivin Booty Dettinger MD  Encounter Date: 10/31/2015      PT End of Session - 10/31/15 1749    Visit Number 3   Number of Visits 16   Date for PT Re-Evaluation 12/05/15   PT Start Time 1645   PT Stop Time 1740   PT Time Calculation (min) 55 min      Past Medical History  Diagnosis Date  . Hypertension     No past surgical history on file.  There were no vitals filed for this visit.  Visit Diagnosis:  Frequent headaches  Neck pain  Bilateral thoracic back pain      Subjective Assessment - 10/31/15 1739    Subjective Did better after last Rx, but am sore on the RT side of my neck and on both sides of my mid-back. Have also had sharp pains across my sternum when twisting or reaching across with my LT arm.    Limitations Sitting   How long can you sit comfortably? 15 minutes.   Patient Stated Goals Want to get back   Currently in Pain? Yes   Pain Score 6    Pain Location Back   Pain Orientation Left;Right;Upper;Mid   Pain Descriptors / Indicators Aching;Sharp;Tightness   Pain Type Acute pain   Aggravating Factors  some ADLs, Reaching   Pain Relieving Factors Rxs, rest                         [facility] Adult PT Treatment/Exercise - 10/31/15 0001    Modalities   Modalities Electrical Stimulation;Moist Heat;Ultrasound   Moist Heat Therapy   Number Minutes Moist Heat 15 Minutes   Moist Heat Location Cervical   and mid-back   Electrical Stimulation   Electrical Stimulation Location RT UT and LT mid-back x 15 mins 2 ch.'s  with pt hooklying   Ultrasound   Ultrasound Location combo x10 min  @ 1.5 w/cm2 to RT U-trap and levator sitting   Manual Therapy   Manual Therapy Soft tissue  mobilization;Myofascial release   Soft tissue mobilization STW/IASTM  to RT UT, levater scap, and Bil. thoracic paras with Pt prone                     PT Long Term Goals - 10/24/15 1539    PT LONG TERM GOAL #1   Title Ind with a HEP.   Time 8   Period Weeks   Status New   PT LONG TERM GOAL #2   Title Increase active cervical rotation to 80 degrees+ so patient can turn head more easily while driving.   Time 8   Period Weeks   Status New   PT LONG TERM GOAL #3   Title Eliminate UE symptoms.   Time 8   Period Weeks   Status New   PT LONG TERM GOAL #4   Title Eliminate headaches.   Time 8   Period Weeks   Status New   PT LONG TERM GOAL #5   Title Perform ADL's with pain not > 3/10.   Time 8   Period Weeks   Status New   Additional Long Term Goals   Additional Long Term Goals Yes   PT LONG TERM GOAL #6  Title Sit 30 minutes with pain not > 3/10.   Time 8   Period Weeks   Status New               Plan - 10/31/15 1806    Clinical Impression Statement Pt did fairly well with Rx today. She still was very sore a long RT UT, and  Levator as well as Bil. thoracic paras. She  had notable trigger points in RT UT and levator. She did C/o having sharp pains on the LT side of her sternum today when reaching across her body. Goals are ongoing    Pt will benefit from skilled therapeutic intervention in order to improve on the following deficits Pain;Decreased range of motion;Decreased activity tolerance   Rehab Potential Excellent   PT Frequency 2x / week   PT Duration 8 weeks   PT Treatment/Interventions Electrical Stimulation;Moist Heat;Therapeutic exercise;Therapeutic activities;Ultrasound;Patient/family education;Manual techniques   PT Next Visit Plan Moist heat andelectrical stimulation; U/S to right UT and mid-back musculature f/b STW/M.  Progress into cervical ROM exercises; Mckenzie extension; scapular retraction exercise.  Suboccipital release technique.    Consulted and Agree with Plan of Care Patient        Problem List Patient Active Problem List   Diagnosis Date Noted  . BMI 40.0-44.9, adult (HCC) 07/10/2015  . Elevated blood pressure 07/10/2015    Priscilla Beasley,Priscilla Beasley, PTA 10/31/2015, 6:13 PM  [facility] [address] [place], [place], [address] Phone: [phone]   Fax:  [phone]  Name: Priscilla Beasley MRN: [id number] Date of Birth: [birth year]

## 2015-11-05 ENCOUNTER — Ambulatory Visit: Payer: 59 | Admitting: Physical Therapy

## 2015-11-05 DIAGNOSIS — M546 Pain in thoracic spine: Secondary | ICD-10-CM

## 2015-11-05 DIAGNOSIS — R51 Headache: Secondary | ICD-10-CM | POA: Diagnosis not present

## 2015-11-05 DIAGNOSIS — M542 Cervicalgia: Secondary | ICD-10-CM

## 2015-11-05 DIAGNOSIS — R519 Headache, unspecified: Secondary | ICD-10-CM

## 2015-11-05 NOTE — Therapy (Signed)
[facility] [address] [place], [place], [address] Phone: [phone]   Fax:  [phone]  Physical Therapy Treatment  Patient Details  Name: Priscilla Beasley MRN: [id number] Date of Birth: 1986/02/10 Referring Provider: Ivin Booty Dettinger MD  Encounter Date: 11/05/2015      PT End of Session - 11/05/15 1708    Visit Number 4   Number of Visits 16   Date for PT Re-Evaluation 12/05/15   PT Start Time 0445   PT Stop Time 0544   PT Time Calculation (min) 59 min      Past Medical History  Diagnosis Date  . Hypertension     No past surgical history on file.  There were no vitals filed for this visit.  Visit Diagnosis:  Frequent headaches  Neck pain  Bilateral thoracic back pain      Subjective Assessment - 11/05/15 1709    Subjective Pain around a 6/10 today especially right shoulder blade region.   Pain Score 6    Pain Location Back   Pain Orientation Right;Left;Upper;Mid   Pain Descriptors / Indicators Aching                         [facility] Adult PT Treatment/Exercise - 11/05/15 0001    Modalities   Modalities Electrical Stimulation   Moist Heat Therapy   Number Minutes Moist Heat 20 Minutes   Moist Heat Location --  RT UT/Medial scapular border.   Programme researcher, broadcasting/film/video Location RT UT/Med scap border right.   Electrical Stimulation Action Pre-mod (5 sec on and 5 sec off) x 20 minutes to right UT and constant at medial scapular border x 2 0 minutes.   Ultrasound   Ultrasound Location Right UT and medial scapular border.   Ultrasound Parameters 1.50 W/CM  at 1.50 x 12 minutes.   Manual Therapy   Soft tissue mobilization STW/M x 20 minutes.                     PT Long Term Goals - 10/24/15 1539    PT LONG TERM GOAL #1   Title Ind with a HEP.   Time 8   Period Weeks   Status New   PT LONG TERM GOAL #2   Title Increase active cervical rotation to 80  degrees+ so patient can turn head more easily while driving.   Time 8   Period Weeks   Status New   PT LONG TERM GOAL #3   Title Eliminate UE symptoms.   Time 8   Period Weeks   Status New   PT LONG TERM GOAL #4   Title Eliminate headaches.   Time 8   Period Weeks   Status New   PT LONG TERM GOAL #5   Title Perform ADL's with pain not > 3/10.   Time 8   Period Weeks   Status New   Additional Long Term Goals   Additional Long Term Goals Yes   PT LONG TERM GOAL #6   Title Sit 30 minutes with pain not > 3/10.   Time 8   Period Weeks   Status New               Problem List Patient Active Problem List   Diagnosis Date Noted  . BMI 40.0-44.9, adult (HCC) 07/10/2015  . Elevated blood pressure 07/10/2015    APPLEGATE, [place] MPT 11/05/2015, 6:11 PM  [facility] [address] [place], [place], [address] Phone: [phone]   Fax:  [phone]  Name: Priscilla Beasley MRN: [id number] Date of Birth: November 14, 1985

## 2015-11-07 ENCOUNTER — Encounter: Payer: Self-pay | Admitting: Physical Therapy

## 2015-11-07 ENCOUNTER — Ambulatory Visit: Payer: 59 | Admitting: Physical Therapy

## 2015-11-07 DIAGNOSIS — M542 Cervicalgia: Secondary | ICD-10-CM

## 2015-11-07 DIAGNOSIS — R51 Headache: Principal | ICD-10-CM

## 2015-11-07 DIAGNOSIS — R519 Headache, unspecified: Secondary | ICD-10-CM

## 2015-11-07 DIAGNOSIS — M546 Pain in thoracic spine: Secondary | ICD-10-CM

## 2015-11-07 NOTE — Patient Instructions (Signed)
Flexibility: Upper Trapezius Stretch    Gently grasp right side of head while reaching behind back with other hand. Tilt head away until a gentle stretch is felt. Hold __30__ seconds. Repeat __3__ times per set. Do _1___ sets per session. Do _2-3___ sessions per day.  http://orth.exer.us/340   Copyright  VHI. All rights reserved.  Levator Scapula Stretch    Place left hand on same side shoulder blade. With other hand, gently stretch head down and away. Hold _30___ seconds. Repeat _3___ times per set. Do __1__ sets per session. Do _2-3___ sessions per day.  http://orth.exer.us/348   Copyright  VHI. All rights reserved.  Angry Cat Stretch    Standing at sink or counter top and holding to edge, lean back until you feel stretch in mid back. Hold 30 seconds. Repeat __3__ times per set. Do __1__ sets per session. Do _2-3___ sessions per day.  http://orth.exer.us/118   Copyright  VHI. All rights reserved.

## 2015-11-07 NOTE — Therapy (Signed)
[facility] [place], [place], [address] Phone: [phone]   Fax:  [phone]  Physical Therapy Treatment  Patient Details  Name: Priscilla Beasley MRN: [id number] Date of Birth: [birth year] Referring Provider: Vonna Kotyk Dettinger MD  Encounter Date: 11/07/2015      PT End of Session - 11/07/15 1651    Visit Number 5   Number of Visits 16   Date for PT Re-Evaluation 12/05/15   PT Start Time 8338   PT Stop Time 2505   PT Time Calculation (min) 52 min   Activity Tolerance Patient tolerated treatment well   Behavior During Therapy [facility] for tasks assessed/performed      Past Medical History  Diagnosis Date  . Hypertension     No past surgical history on file.  There were no vitals filed for this visit.  Visit Diagnosis:  Frequent headaches  Neck pain  Bilateral thoracic back pain      Subjective Assessment - 11/07/15 1649    Subjective Reports not having as much pain today she noticed but was moving around. Reports that her neck is "betterish" but not completely painfree.   Limitations Sitting   How long can you sit comfortably? 15 minutes.   Patient Stated Goals Want to get back   Currently in Pain? Yes   Pain Score 4    Pain Location Back   Pain Orientation Right;Left;Upper;Mid   Pain Descriptors / Indicators Dull;Sore;Sharp   Pain Type Acute pain            [facility] PT Assessment - 11/07/15 0001    Assessment   Medical Diagnosis Neck pain.   Onset Date/Surgical Date 10/03/15   Precautions   Precautions None   ROM / Strength   AROM / PROM / Strength AROM   AROM   Overall AROM  Deficits   AROM Assessment Site Cervical   Cervical - Right Rotation 67   Cervical - Left Rotation 69                     [facility] Adult PT Treatment/Exercise - 11/07/15 0001    Modalities   Modalities Electrical Stimulation;Moist Heat;Ultrasound   Moist Heat Therapy   Number Minutes Moist Heat 15 Minutes   Moist Heat Location Lumbar Spine   Electrical Stimulation   Electrical Stimulation Location R UT/ mid thoracic   Electrical Stimulation Action Pre-Mod   Electrical Stimulation Parameters 80-150 hz x15 min   Electrical Stimulation Goals Pain   Ultrasound   Ultrasound Location R UT/ cervical paraspinals   Ultrasound Parameters 1.5 w/cm2, 100%,1 mhz x10 min   Ultrasound Goals Pain   Manual Therapy   Manual Therapy Myofascial release   Myofascial Release MFR/TPR to R UT, Levator Scapula in sitting to decrease pain and tighness   Neck Exercises: Stretches   Upper Trapezius Stretch 3 reps;30 seconds   Levator Stretch 3 reps;30 seconds   Other Neck Stretches Mid back stretch in standing 3x30 sec                PT Education - 11/07/15 1719    Education provided Yes   Education Details HEP- UT, Levator Scapula, mid back stretch in standing   Person(s) Educated Patient   Methods Explanation;Demonstration;Verbal cues;Handout   Comprehension Verbalized understanding;Returned demonstration;Verbal cues required             PT Long Term Goals - 11/07/15 1720    PT LONG TERM GOAL #1   Title  Ind with a HEP.   Time 8   Period Weeks   Status On-going   PT LONG TERM GOAL #2   Title Increase active cervical rotation to 80 degrees+ so patient can turn head more easily while driving.   Time 8   Period Weeks   Status On-going  AROM R cervical rotation 67 deg, L cervical rotation 69 deg 11/07/2015   PT LONG TERM GOAL #3   Title Eliminate UE symptoms.   Time 8   Period Weeks   Status Achieved   PT LONG TERM GOAL #4   Title Eliminate headaches.   Time 8   Period Weeks   Status Partially Met  Intermittant HA reported 11/07/2015   PT LONG TERM GOAL #5   Title Perform ADL's with pain not > 3/10.   Time 8   Period Weeks   Status On-going   PT LONG TERM GOAL #6   Title Sit 30 minutes with pain not > 3/10.   Time 8   Period Weeks   Status Partially Met  Lack of proper posture  causes pain with prolonged sitting 11/07/2015               Plan - 11/07/15 1733    Clinical Impression Statement Patient tolerated today's treatment well although she had experienced pain in both cervical spine and mid back prior to treatment. Patient experienced cervcial pain constantly but mid back pain intermittantly per patient report. Patient presented with notable tightness and TP in mediosuperior aspect of R UT today. Educated patient regarding stretches for home and demonstrated proper stretch technique for patient. Patient tolerated stretches well and reported experiencing a stretch with each of the stretches. Normal modalites response noted following removal of the modalities. Achieved goal regarding eliminating UE symptoms, partially achieving goals regarding eliminating headaches and sit for 30 minutes goal. Improvements were noted in B cervical rotation measurements today with 67 deg R cervical rotation and 69 deg L cervical rotation. Patient accepted HEP for stretches without questions and verbalized understanding. Experienced "a little" R cervical muscle pain following today's treatment.   Pt will benefit from skilled therapeutic intervention in order to improve on the following deficits Pain;Decreased range of motion;Decreased activity tolerance   Rehab Potential Excellent   PT Frequency 2x / week   PT Duration 8 weeks   PT Treatment/Interventions Electrical Stimulation;Moist Heat;Therapeutic exercise;Therapeutic activities;Ultrasound;Patient/family education;Manual techniques   PT Next Visit Plan Continue manual therapy and modalities as needed per MPT POC.   PT Home Exercise Plan HEP- UT, Levator Scapula, mid back stretch in standing   Consulted and Agree with Plan of Care Patient        Problem List Patient Active Problem List   Diagnosis Date Noted  . BMI 40.0-44.9, adult ([place]) 07/10/2015  . Elevated blood pressure 07/10/2015    Wynelle Fanny, PTA 11/07/2015,  5:50 PM  [facility] [address] [place], [place], [address] Phone: [phone]   Fax:  [phone]  Name: Priscilla Beasley MRN: [id number] Date of Birth: 1986/08/12

## 2015-11-12 ENCOUNTER — Ambulatory Visit: Payer: 59 | Admitting: Physical Therapy

## 2015-11-12 DIAGNOSIS — R51 Headache: Secondary | ICD-10-CM | POA: Diagnosis not present

## 2015-11-12 DIAGNOSIS — R519 Headache, unspecified: Secondary | ICD-10-CM

## 2015-11-12 DIAGNOSIS — M542 Cervicalgia: Secondary | ICD-10-CM

## 2015-11-12 DIAGNOSIS — M546 Pain in thoracic spine: Secondary | ICD-10-CM

## 2015-11-12 NOTE — Therapy (Signed)
[facility] [place], [place], [address] Phone: [phone]   Fax:  [phone]  Physical Therapy Treatment  Patient Details  Name: Priscilla Beasley MRN: [id number] Date of Birth: June 27, 1986 Referring Provider: Vonna Kotyk Dettinger MD  Encounter Date: 11/12/2015      PT End of Session - 11/12/15 1708    Visit Number 6   Number of Visits 16   Date for PT Re-Evaluation 12/05/15   PT Start Time 0456   PT Stop Time 0604   PT Time Calculation (min) 68 min   Activity Tolerance Patient tolerated treatment well   Behavior During Therapy [facility] for tasks assessed/performed      Past Medical History  Diagnosis Date  . Hypertension     No past surgical history on file.  There were no vitals filed for this visit.  Visit Diagnosis:  Frequent headaches  Neck pain  Bilateral thoracic back pain      Subjective Assessment - 11/12/15 1706    Subjective The treatments are definitely helping.   How long can you sit comfortably? 15 minutes.   Patient Stated Goals Want to get back   Pain Score 3    Pain Location Back   Pain Orientation Right;Mid   Pain Descriptors / Indicators Dull;Sore   Pain Type Acute pain                         [facility] Adult PT Treatment/Exercise - 11/12/15 0001    Modalities   Modalities Electrical Stimulation   Electrical Stimulation   Electrical Stimulation Location RT UT/Mid-thoracic   Electrical Stimulation Action Pre-mod e'stim x 20 minutes 4 electrodes rigth UT to mid-thoracic at 80-150x  HZ    Electrical Stimulation Parameters 5 sec on and 5 sec off.   Electrical Stimulation Goals Pain   Ultrasound   Ultrasound Location Combo E'stim/U/S.   Ultrasound Parameters 1.50 W/CM2 x 12 minutes.   Ultrasound Goals Pain   Manual Therapy   Manual Therapy Soft tissue mobilization;Myofascial release   Manual therapy comments x 26 minutes to affected area including right UT release technique.                      PT Long Term Goals - 11/07/15 1720    PT LONG TERM GOAL #1   Title Ind with a HEP.   Time 8   Period Weeks   Status On-going   PT LONG TERM GOAL #2   Title Increase active cervical rotation to 80 degrees+ so patient can turn head more easily while driving.   Time 8   Period Weeks   Status On-going  AROM R cervical rotation 67 deg, L cervical rotation 69 deg 11/07/2015   PT LONG TERM GOAL #3   Title Eliminate UE symptoms.   Time 8   Period Weeks   Status Achieved   PT LONG TERM GOAL #4   Title Eliminate headaches.   Time 8   Period Weeks   Status Partially Met  Intermittant HA reported 11/07/2015   PT LONG TERM GOAL #5   Title Perform ADL's with pain not > 3/10.   Time 8   Period Weeks   Status On-going   PT LONG TERM GOAL #6   Title Sit 30 minutes with pain not > 3/10.   Time 8   Period Weeks   Status Partially Met  Lack of proper posture causes pain with prolonged sitting 11/07/2015  Problem List Patient Active Problem List   Diagnosis Date Noted  . BMI 40.0-44.9, adult ([place]) 07/10/2015  . Elevated blood pressure 07/10/2015    Alyxandra Tenbrink, [place] MPT 11/12/2015, 6:41 PM  [facility] [address] [place], [place], [address] Phone: [phone]   Fax:  [phone]  Name: Kylea Berrong MRN: [id number] Date of Birth: 28-Dec-1985

## 2015-11-14 ENCOUNTER — Ambulatory Visit: Payer: 59 | Attending: Family Medicine | Admitting: Physical Therapy

## 2015-11-14 DIAGNOSIS — M546 Pain in thoracic spine: Secondary | ICD-10-CM | POA: Diagnosis present

## 2015-11-14 DIAGNOSIS — R51 Headache: Secondary | ICD-10-CM | POA: Insufficient documentation

## 2015-11-14 DIAGNOSIS — M542 Cervicalgia: Secondary | ICD-10-CM

## 2015-11-14 DIAGNOSIS — R519 Headache, unspecified: Secondary | ICD-10-CM

## 2015-11-14 NOTE — Therapy (Signed)
[facility] [place], [place], [address] Phone: [phone]   Fax:  [phone]  Physical Therapy Treatment  Patient Details  Name: Priscilla Beasley MRN: [id number] Date of Birth: 23-Nov-1985 Referring Provider: Vonna Kotyk Dettinger MD  Encounter Date: 11/14/2015      PT End of Session - 11/14/15 1835    Visit Number 7   Number of Visits 16   Date for PT Re-Evaluation 12/05/15   PT Start Time 0451   PT Stop Time 0549   PT Time Calculation (min) 58 min   Activity Tolerance Patient tolerated treatment well   Behavior During Therapy [facility] for tasks assessed/performed      Past Medical History  Diagnosis Date  . Hypertension     No past surgical history on file.  There were no vitals filed for this visit.  Visit Diagnosis:  Frequent headaches  Neck pain  Bilateral thoracic back pain      Subjective Assessment - 11/14/15 1836    Subjective I am doing better.  My pain is a 3/10 today on the right side.   Limitations Sitting   How long can you sit comfortably? 15 minutes.   Currently in Pain? Yes   Pain Score 3    Pain Location Back   Pain Orientation Right;Mid   Pain Descriptors / Indicators Dull;Sore   Pain Type Acute pain                         [facility] Adult PT Treatment/Exercise - 11/14/15 0001    Modalities   Modalities Electrical Stimulation   Moist Heat Therapy   Number Minutes Moist Heat 20 Minutes   Electrical Stimulation   Electrical Stimulation Location RT UT/Mid-thoracic.   Electrical Stimulation Action Pre-mod e' stim x 20 minutes.   Electrical Stimulation Parameters (5 seon on and 5 sec off).   Electrical Stimulation Goals Pain   Manual Therapy   Manual Therapy Soft tissue mobilization   Manual therapy comments to RT UT which included TP release technique and to mid-trap and Rhomboids x26 minutes.                     PT Long Term Goals - 11/07/15 1720    PT LONG  TERM GOAL #1   Title Ind with a HEP.   Time 8   Period Weeks   Status On-going   PT LONG TERM GOAL #2   Title Increase active cervical rotation to 80 degrees+ so patient can turn head more easily while driving.   Time 8   Period Weeks   Status On-going  AROM R cervical rotation 67 deg, L cervical rotation 69 deg 11/07/2015   PT LONG TERM GOAL #3   Title Eliminate UE symptoms.   Time 8   Period Weeks   Status Achieved   PT LONG TERM GOAL #4   Title Eliminate headaches.   Time 8   Period Weeks   Status Partially Met  Intermittant HA reported 11/07/2015   PT LONG TERM GOAL #5   Title Perform ADL's with pain not > 3/10.   Time 8   Period Weeks   Status On-going   PT LONG TERM GOAL #6   Title Sit 30 minutes with pain not > 3/10.   Time 8   Period Weeks   Status Partially Met  Lack of proper posture causes pain with prolonged sitting 11/07/2015  Plan - 11/14/15 1839    Clinical Impression Statement the patient is progressing very well with her physical therapy treatments.  She has had a lowered pain-level this week rated at 3/10.   Pt will benefit from skilled therapeutic intervention in order to improve on the following deficits Pain;Decreased range of motion;Decreased activity tolerance   Rehab Potential Excellent   PT Frequency 2x / week   PT Duration 8 weeks   PT Treatment/Interventions Electrical Stimulation;Moist Heat;Therapeutic exercise;Therapeutic activities;Ultrasound;Patient/family education;Manual techniques   PT Next Visit Plan Continue manual therapy and modalities as needed per MPT POC.   PT Home Exercise Plan HEP- UT, Levator Scapula, mid back stretch in standing   Consulted and Agree with Plan of Care Patient        Problem List Patient Active Problem List   Diagnosis Date Noted  . BMI 40.0-44.9, adult ([place]) 07/10/2015  . Elevated blood pressure 07/10/2015    Yuuki Skeens, [place]  MPT  11/14/2015, 6:42 PM  [facility] [place], [place], [address] Phone: [phone]   Fax:  [phone]  Name: Ashauna Bertholf MRN: [id number] Date of Birth: [birth year]

## 2015-11-26 ENCOUNTER — Ambulatory Visit: Payer: 59 | Admitting: Physical Therapy

## 2015-11-26 DIAGNOSIS — M542 Cervicalgia: Secondary | ICD-10-CM

## 2015-11-26 DIAGNOSIS — R51 Headache: Principal | ICD-10-CM

## 2015-11-26 DIAGNOSIS — M546 Pain in thoracic spine: Secondary | ICD-10-CM

## 2015-11-26 DIAGNOSIS — R519 Headache, unspecified: Secondary | ICD-10-CM

## 2015-11-26 NOTE — Therapy (Signed)
[facility] [place], [place], [address] Phone: [phone]   Fax:  [phone]  Physical Therapy Treatment  Patient Details  Name: Priscilla Beasley MRN: [id number] Date of Birth: 1986-03-29 Referring Provider: Vonna Kotyk Dettinger MD  Encounter Date: 11/26/2015      PT End of Session - 11/26/15 1757    Visit Number 8   Number of Visits 16   Date for PT Re-Evaluation 12/05/15   PT Start Time 0456   PT Stop Time 0542   PT Time Calculation (min) 46 min      Past Medical History  Diagnosis Date  . Hypertension     No past surgical history on file.  There were no vitals filed for this visit.  Visit Diagnosis:  Frequent headaches  Neck pain  Bilateral thoracic back pain      Subjective Assessment - 11/26/15 1750    Subjective I'm doing much better, about 50% better.   Pain Score 2    Pain Location Back   Pain Orientation Right;Mid   Pain Descriptors / Indicators Dull;Sore   Pain Type Acute pain                         [facility] Adult PT Treatment/Exercise - 11/26/15 0001    Manual Therapy   Manual therapy comments STW/M to patient's bilateral UT/thoracic region and TP release technique x 38 minutes.                     PT Long Term Goals - 11/07/15 1720    PT LONG TERM GOAL #1   Title Ind with a HEP.   Time 8   Period Weeks   Status On-going   PT LONG TERM GOAL #2   Title Increase active cervical rotation to 80 degrees+ so patient can turn head more easily while driving.   Time 8   Period Weeks   Status On-going  AROM R cervical rotation 67 deg, L cervical rotation 69 deg 11/07/2015   PT LONG TERM GOAL #3   Title Eliminate UE symptoms.   Time 8   Period Weeks   Status Achieved   PT LONG TERM GOAL #4   Title Eliminate headaches.   Time 8   Period Weeks   Status Partially Met  Intermittant HA reported 11/07/2015   PT LONG TERM GOAL #5   Title Perform ADL's with pain not  > 3/10.   Time 8   Period Weeks   Status On-going   PT LONG TERM GOAL #6   Title Sit 30 minutes with pain not > 3/10.   Time 8   Period Weeks   Status Partially Met  Lack of proper posture causes pain with prolonged sitting 11/07/2015               Problem List Patient Active Problem List   Diagnosis Date Noted  . BMI 40.0-44.9, adult ([place]) 07/10/2015  . Elevated blood pressure 07/10/2015    APPLEGATE, [place] MPT 11/26/2015, 5:58 PM  [facility] [address] [place], [place], [address] Phone: [phone]   Fax:  [phone]  Name: Myrtis Maille MRN: [id number] Date of Birth: 1986/08/10

## 2015-12-03 ENCOUNTER — Ambulatory Visit: Payer: 59 | Admitting: *Deleted

## 2015-12-03 DIAGNOSIS — M542 Cervicalgia: Secondary | ICD-10-CM

## 2015-12-03 DIAGNOSIS — R519 Headache, unspecified: Secondary | ICD-10-CM

## 2015-12-03 DIAGNOSIS — R51 Headache: Principal | ICD-10-CM

## 2015-12-03 DIAGNOSIS — M546 Pain in thoracic spine: Secondary | ICD-10-CM

## 2015-12-03 NOTE — Therapy (Signed)
[facility] [place], [place], [address] Phone: [phone]   Fax:  [phone]  Physical Therapy Treatment  Patient Details  Name: Priscilla Beasley MRN: [id number] Date of Birth: 1986-01-14 Referring Provider: Vonna Kotyk Dettinger MD  Encounter Date: 12/03/2015      PT End of Session - 12/03/15 1757    Visit Number 9   Number of Visits 16   Date for PT Re-Evaluation 12/05/15   PT Start Time 2878   PT Stop Time 1739   PT Time Calculation (min) 51 min      Past Medical History  Diagnosis Date  . Hypertension     No past surgical history on file.  There were no vitals filed for this visit.  Visit Diagnosis:  Frequent headaches  Neck pain  Bilateral thoracic back pain      Subjective Assessment - 12/03/15 1651    Subjective I'm doing much better, about 50% better. LT Utrap and RT side of the neck   Limitations Sitting   How long can you sit comfortably? 15 minutes.   Currently in Pain? Yes   Pain Score 3    Pain Location Back   Pain Orientation Right;Mid   Pain Descriptors / Indicators Sore;Dull   Pain Type Acute pain                         [facility] Adult PT Treatment/Exercise - 12/03/15 0001    Ultrasound   Ultrasound Location Combo to RT and LT UT x 10 minutes each (20 mins total)   Ultrasound Parameters 1.5 w/cm2 x 20 mins   Ultrasound Goals Pain   Manual Therapy   Manual Therapy Soft tissue mobilization;Myofascial release   Myofascial Release MFR/TPR to RT/LT  UT, Levator Scapula in sitting to decrease pain and tighness                     PT Long Term Goals - 11/07/15 1720    PT LONG TERM GOAL #1   Title Ind with a HEP.   Time 8   Period Weeks   Status On-going   PT LONG TERM GOAL #2   Title Increase active cervical rotation to 80 degrees+ so patient can turn head more easily while driving.   Time 8   Period Weeks   Status On-going  AROM R cervical rotation 67  deg, L cervical rotation 69 deg 11/07/2015   PT LONG TERM GOAL #3   Title Eliminate UE symptoms.   Time 8   Period Weeks   Status Achieved   PT LONG TERM GOAL #4   Title Eliminate headaches.   Time 8   Period Weeks   Status Partially Met  Intermittant HA reported 11/07/2015   PT LONG TERM GOAL #5   Title Perform ADL's with pain not > 3/10.   Time 8   Period Weeks   Status On-going   PT LONG TERM GOAL #6   Title Sit 30 minutes with pain not > 3/10.   Time 8   Period Weeks   Status Partially Met  Lack of proper posture causes pain with prolonged sitting 11/07/2015               Plan - 12/03/15 1759    Clinical Impression Statement Pt did fairly well today with Rx, but continues to have notable tightness and TPs in Bil. UT's and Levator scap on LT. She had decreased pain  and TPs after Rx, but not complete releases. She feels tightness in LT side when turning LT and in RT when turning RT. Goals are ongoing   Pt will benefit from skilled therapeutic intervention in order to improve on the following deficits Pain;Decreased range of motion;Decreased activity tolerance   Rehab Potential Excellent   PT Frequency 2x / week   PT Duration 8 weeks   PT Treatment/Interventions Electrical Stimulation;Moist Heat;Therapeutic exercise;Therapeutic activities;Ultrasound;Patient/family education;Manual techniques   PT Next Visit Plan Continue manual therapy and modalities as needed per MPT POC.   PT Home Exercise Plan HEP- UT, Levator Scapula, mid back stretch in standing   Consulted and Agree with Plan of Care Patient        Problem List Patient Active Problem List   Diagnosis Date Noted  . BMI 40.0-44.9, adult ([place]) 07/10/2015  . Elevated blood pressure 07/10/2015    RAMSEUR,CHRIS, PTA 12/03/2015, 6:11 PM  [facility] [place], [place], [address] Phone: [phone]   Fax:  [phone]  Name: Priscilla Beasley MRN:  [id number] Date of Birth: 1986-01-29

## 2015-12-05 ENCOUNTER — Ambulatory Visit: Payer: 59 | Admitting: *Deleted

## 2015-12-05 DIAGNOSIS — M542 Cervicalgia: Secondary | ICD-10-CM

## 2015-12-05 DIAGNOSIS — M546 Pain in thoracic spine: Secondary | ICD-10-CM

## 2015-12-05 DIAGNOSIS — R519 Headache, unspecified: Secondary | ICD-10-CM

## 2015-12-05 DIAGNOSIS — R51 Headache: Secondary | ICD-10-CM | POA: Diagnosis not present

## 2015-12-05 NOTE — Therapy (Signed)
[facility] [place], [place], [address] Phone: [phone]   Fax:  [phone]  Physical Therapy Treatment  Patient Details  Name: Priscilla Beasley MRN: [id number] Date of Birth: [birth year] Referring Provider: Vonna Kotyk Dettinger MD  Encounter Date: 12/05/2015      PT End of Session - 12/05/15 1737    Visit Number 10   Number of Visits 16   Date for PT Re-Evaluation 12/05/15   PT Start Time 6962   PT Stop Time 9528   PT Time Calculation (min) 53 min      Past Medical History  Diagnosis Date  . Hypertension     No past surgical history on file.  There were no vitals filed for this visit.  Visit Diagnosis:  Frequent headaches  Neck pain  Bilateral thoracic back pain      Subjective Assessment - 12/05/15 1731    Subjective I'm doing much better, about 50% better. LT Utrap and RT side of the neck   Limitations Sitting   How long can you sit comfortably? 15 minutes.   Patient Stated Goals Want to get back   Currently in Pain? Yes   Pain Score 4    Pain Location Back   Pain Orientation Mid;Right   Pain Descriptors / Indicators Sore   Pain Type Acute pain                                      PT Long Term Goals - 11/07/15 1720    PT LONG TERM GOAL #1   Title Ind with a HEP.   Time 8   Period Weeks   Status On-going   PT LONG TERM GOAL #2   Title Increase active cervical rotation to 80 degrees+ so patient can turn head more easily while driving.   Time 8   Period Weeks   Status On-going  AROM R cervical rotation 67 deg, L cervical rotation 69 deg 11/07/2015   PT LONG TERM GOAL #3   Title Eliminate UE symptoms.   Time 8   Period Weeks   Status Achieved   PT LONG TERM GOAL #4   Title Eliminate headaches.   Time 8   Period Weeks   Status Partially Met  Intermittant HA reported 11/07/2015   PT LONG TERM GOAL #5   Title Perform ADL's with pain not > 3/10.   Time 8   Period Weeks   Status On-going   PT LONG TERM GOAL #6   Title Sit 30 minutes with pain not > 3/10.   Time 8   Period Weeks   Status Partially Met  Lack of proper posture causes pain with prolonged sitting 11/07/2015               Plan - 12/05/15 1738    Clinical Impression Statement pt did fairly well with Rx today, but had notable tightness in RT cervical paras and LT Utrap. She was unable to meet LTG for ROM and pain with ADLs due to Pain and tightness.   Pt will benefit from skilled therapeutic intervention in order to improve on the following deficits Pain;Decreased range of motion;Decreased activity tolerance   Rehab Potential Excellent   PT Frequency 2x / week   PT Duration 8 weeks   PT Treatment/Interventions Electrical Stimulation;Moist Heat;Therapeutic exercise;Therapeutic activities;Ultrasound;Patient/family education;Manual techniques   PT Next Visit Plan Continue manual therapy and modalities  as needed per MPT POC.   PT Home Exercise Plan HEP- UT, Levator Scapula, mid back stretch in standing   Consulted and Agree with Plan of Care Patient        Problem List Patient Active Problem List   Diagnosis Date Noted  . BMI 40.0-44.9, adult ([place]) 07/10/2015  . Elevated blood pressure 07/10/2015    APPLEGATE, [place], PTA 12/06/2015, 9:49 AM  [place] MPT [facility] [place], [place], [address] Phone: [phone]   Fax:  [phone]  Name: Ardra Kuznicki MRN: [id number] Date of Birth: 08-14-1986

## 2015-12-10 ENCOUNTER — Ambulatory Visit: Payer: 59 | Admitting: *Deleted

## 2015-12-10 DIAGNOSIS — M546 Pain in thoracic spine: Secondary | ICD-10-CM

## 2015-12-10 DIAGNOSIS — R519 Headache, unspecified: Secondary | ICD-10-CM

## 2015-12-10 DIAGNOSIS — R51 Headache: Secondary | ICD-10-CM | POA: Diagnosis not present

## 2015-12-10 DIAGNOSIS — M542 Cervicalgia: Secondary | ICD-10-CM

## 2015-12-10 NOTE — Therapy (Signed)
[facility] [place], [place], [address] Phone: [phone]   Fax:  [phone]  Physical Therapy Treatment  Patient Details  Name: Priscilla Beasley MRN: [id number] Date of Birth: 1986-04-11 Referring Provider: Vonna Kotyk Dettinger MD  Encounter Date: 12/10/2015      PT End of Session - 12/10/15 1746    Visit Number 11   Number of Visits 16   Date for PT Re-Evaluation 12/05/15   PT Start Time 3875   PT Stop Time 6433   PT Time Calculation (min) 60 min      Past Medical History  Diagnosis Date  . Hypertension     No past surgical history on file.  There were no vitals filed for this visit.  Visit Diagnosis:  Frequent headaches  Neck pain  Bilateral thoracic back pain      Subjective Assessment - 12/10/15 1747    Subjective I'm doing much better, about 50% better. LT Utrap and RT side of the neck. Not as sore today   Limitations Sitting   How long can you sit comfortably? 15 minutes.   Patient Stated Goals Want to get back   Currently in Pain? Yes   Pain Score 3    Pain Location Neck   Pain Orientation Left;Right   Pain Descriptors / Indicators Sore   Pain Type Acute pain                         [facility] Adult PT Treatment/Exercise - 12/10/15 0001    Modalities   Modalities Electrical Stimulation;Moist Heat;Ultrasound   Moist Heat Therapy   Number Minutes Moist Heat 15 Minutes   Moist Heat Location Cervical   Electrical Stimulation   Electrical Stimulation Location RT/LT Utrap and levator premod x 15 mins 80-150hz , 5secson/off   Electrical Stimulation Goals Pain   Ultrasound   Ultrasound Location Combo x 20 mins at 1.5 w/cm2 10 mins to each side   Ultrasound Goals Pain   Manual Therapy   Manual Therapy Soft tissue mobilization;Myofascial release   Myofascial Release MFR/TPR to RT/LT  UT, Levator Scapula and RT paras in sitting to decrease pain and tighness                      PT Long Term Goals - 11/07/15 1720    PT LONG TERM GOAL #1   Title Ind with a HEP.   Time 8   Period Weeks   Status On-going   PT LONG TERM GOAL #2   Title Increase active cervical rotation to 80 degrees+ so patient can turn head more easily while driving.   Time 8   Period Weeks   Status On-going  AROM R cervical rotation 67 deg, L cervical rotation 69 deg 11/07/2015   PT LONG TERM GOAL #3   Title Eliminate UE symptoms.   Time 8   Period Weeks   Status Achieved   PT LONG TERM GOAL #4   Title Eliminate headaches.   Time 8   Period Weeks   Status Partially Met  Intermittant HA reported 11/07/2015   PT LONG TERM GOAL #5   Title Perform ADL's with pain not > 3/10.   Time 8   Period Weeks   Status On-going   PT LONG TERM GOAL #6   Title Sit 30 minutes with pain not > 3/10.   Time 8   Period Weeks   Status Partially Met  Lack of  proper posture causes pain with prolonged sitting 11/07/2015               Plan - 12/10/15 1804    Clinical Impression Statement Pt was feeling better today with less pain in neck and Utraps. She also had less tighness and fewer TPs as well. She responded well to all modalities and had normal response after removal of modalitoies. ROM goal is on-going   Pt will benefit from skilled therapeutic intervention in order to improve on the following deficits Pain;Decreased range of motion;Decreased activity tolerance   Rehab Potential Excellent   PT Frequency 2x / week   PT Duration 8 weeks   PT Next Visit Plan Continue manual therapy and modalities as needed per MPT POC.  MD note sent?   PT Home Exercise Plan HEP- UT, Levator Scapula, mid back stretch in standing   Consulted and Agree with Plan of Care Patient        Problem List Patient Active Problem List   Diagnosis Date Noted  . BMI 40.0-44.9, adult ([place]) 07/10/2015  . Elevated blood pressure 07/10/2015    RAMSEUR,CHRIS, PTA 12/10/2015, 6:08 PM  [facility] [address] [place], [place], [address] Phone: [phone]   Fax:  [phone]  Name: Priscilla Beasley MRN: [id number] Date of Birth: [birth year]

## 2015-12-12 ENCOUNTER — Ambulatory Visit: Payer: 59 | Attending: Family Medicine | Admitting: *Deleted

## 2015-12-12 DIAGNOSIS — M542 Cervicalgia: Secondary | ICD-10-CM

## 2015-12-12 DIAGNOSIS — M546 Pain in thoracic spine: Secondary | ICD-10-CM

## 2015-12-12 DIAGNOSIS — R51 Headache: Secondary | ICD-10-CM | POA: Insufficient documentation

## 2015-12-12 DIAGNOSIS — R519 Headache, unspecified: Secondary | ICD-10-CM

## 2015-12-12 NOTE — Therapy (Signed)
[facility] [place], [place], [address] Phone: [phone]   Fax:  [phone]  Physical Therapy Treatment  Patient Details  Name: Priscilla Beasley MRN: [id number] Date of Birth: 1986/06/10 Referring Provider: Vonna Kotyk Dettinger MD  Encounter Date: 12/12/2015      PT End of Session - 12/12/15 1800    Visit Number 12   Number of Visits 16   PT Start Time 7341   PT Stop Time 9379   PT Time Calculation (min) 60 min      Past Medical History  Diagnosis Date  . Hypertension     No past surgical history on file.  There were no vitals filed for this visit.  Visit Diagnosis:  Frequent headaches  Neck pain  Bilateral thoracic back pain                       [facility] Adult PT Treatment/Exercise - 12/12/15 0001    Exercises   Exercises Neck   Neck Exercises: Theraband   Other Theraband Exercises XTS green  4x10 scapular retractions   Moist Heat Therapy   Number Minutes Moist Heat 15 Minutes   Moist Heat Location Cervical   Electrical Stimulation   Electrical Stimulation Location RT/LT Utrap and levator premod x 15 mins 80-_0 , 5secson/off   Ultrasound   Ultrasound Location Combo x 20 mins (10 each side)  @ 1.5 w/cm2 LT and RT UTs   Ultrasound Goals Pain   Manual Therapy   Manual Therapy Soft tissue mobilization;Myofascial release   Myofascial Release MFR/TPR to RT/LT  UT, Levator Scapula and RT paras in sitting to decrease pain and tighness                     PT Long Term Goals - 11/07/15 1720    PT LONG TERM GOAL #1   Title Ind with a HEP.   Time 8   Period Weeks   Status On-going   PT LONG TERM GOAL #2   Title Increase active cervical rotation to 80 degrees+ so patient can turn head more easily while driving.   Time 8   Period Weeks   Status On-going  AROM R cervical rotation 67 deg, L cervical rotation 69 deg 11/07/2015   PT LONG TERM GOAL #3   Title Eliminate UE symptoms.    Time 8   Period Weeks   Status Achieved   PT LONG TERM GOAL #4   Title Eliminate headaches.   Time 8   Period Weeks   Status Partially Met  Intermittant HA reported 11/07/2015   PT LONG TERM GOAL #5   Title Perform ADL's with pain not > 3/10.   Time 8   Period Weeks   Status On-going   PT LONG TERM GOAL #6   Title Sit 30 minutes with pain not > 3/10.   Time 8   Period Weeks   Status Partially Met  Lack of proper posture causes pain with prolonged sitting 11/07/2015               Plan - 12/12/15 1805    Clinical Impression Statement Pt was not  feeling as good today. Her pain was 4-5/10 in her neck. She felt this way when she woke up. She had notable increased tightness in LT  UT and levator and had decreased cervical rotation ROM to LT and RT.  62 degrees LT and 65 RT   Pt will benefit from  skilled therapeutic intervention in order to improve on the following deficits Pain;Decreased range of motion;Decreased activity tolerance   Rehab Potential Excellent   PT Frequency 2x / week   PT Duration 8 weeks   PT Treatment/Interventions Electrical Stimulation;Moist Heat;Therapeutic exercise;Therapeutic activities;Ultrasound;Patient/family education;Manual techniques   PT Next Visit Plan Continue manual therapy and modalities as needed per MPT POC.  MD note sent?   Try supine manual STW   PT Home Exercise Plan HEP- UT, Levator Scapula, mid back stretch in standing   Consulted and Agree with Plan of Care Patient        Problem List Patient Active Problem List   Diagnosis Date Noted  . BMI 40.0-44.9, adult ([place]) 07/10/2015  . Elevated blood pressure 07/10/2015    RAMSEUR,CHRIS, PTA 12/12/2015, 6:18 PM  [facility] [place], [place], [address] Phone: [phone]   Fax:  [phone]  Name: Priscilla Beasley MRN: [id number] Date of Birth: 11-05-1985

## 2015-12-17 ENCOUNTER — Ambulatory Visit: Payer: 59 | Admitting: *Deleted

## 2015-12-17 DIAGNOSIS — R519 Headache, unspecified: Secondary | ICD-10-CM

## 2015-12-17 DIAGNOSIS — R51 Headache: Principal | ICD-10-CM

## 2015-12-17 DIAGNOSIS — M542 Cervicalgia: Secondary | ICD-10-CM

## 2015-12-17 DIAGNOSIS — M546 Pain in thoracic spine: Secondary | ICD-10-CM

## 2015-12-17 NOTE — Therapy (Signed)
[facility] [place], [place], [address] Phone: [phone]   Fax:  [phone]  Physical Therapy Treatment  Patient Details  Name: Priscilla Beasley MRN: [id number] Date of Birth: 02/19/1986 Referring Provider: Vonna Kotyk Dettinger MD  Encounter Date: 12/17/2015      PT End of Session - 12/17/15 1704    Visit Number 13   Number of Visits 16   Date for PT Re-Evaluation 12/05/15   PT Start Time 2119   PT Stop Time 1700   PT Time Calculation (min) 51 min      Past Medical History  Diagnosis Date  . Hypertension     No past surgical history on file.  There were no vitals filed for this visit.  Visit Diagnosis:  Frequent headaches  Neck pain  Bilateral thoracic back pain      Subjective Assessment - 12/17/15 1609    Subjective (p) pain is about a 2/10 today. Not bad   Limitations (p) Sitting   How long can you sit comfortably? (p) 15 minutes.   Patient Stated Goals (p) Want to get back   Currently in Pain? (p) Yes   Pain Score (p) 3                          [facility] Adult PT Treatment/Exercise - 12/17/15 0001    Neck Exercises: Theraband   Other Theraband Exercises XTS green  4x10 scapular retractions   Modalities   Modalities Electrical Stimulation;Moist Heat;Ultrasound   Moist Heat Therapy   Number Minutes Moist Heat 15 Minutes   Moist Heat Location Cervical   Electrical Stimulation   Electrical Stimulation Location RT/LT Utrap and levator premod x 15 mins 80-150hz , 5secson/off   Electrical Stimulation Goals Pain   Ultrasound   Ultrasound Location combo 1.5 w/cm2 x 10 mins   Ultrasound Goals Pain   Manual Therapy   Manual Therapy Soft tissue mobilization;Myofascial release   Myofascial Release MFR/TPR to RT/LT  UT, Levator Scapula and RT paras in sitting to decrease pain and tighness                     PT Long Term Goals - 11/07/15 1720    PT LONG TERM GOAL #1   Title  Ind with a HEP.   Time 8   Period Weeks   Status On-going   PT LONG TERM GOAL #2   Title Increase active cervical rotation to 80 degrees+ so patient can turn head more easily while driving.   Time 8   Period Weeks   Status On-going  AROM R cervical rotation 67 deg, L cervical rotation 69 deg 11/07/2015   PT LONG TERM GOAL #3   Title Eliminate UE symptoms.   Time 8   Period Weeks   Status Achieved   PT LONG TERM GOAL #4   Title Eliminate headaches.   Time 8   Period Weeks   Status Partially Met  Intermittant HA reported 11/07/2015   PT LONG TERM GOAL #5   Title Perform ADL's with pain not > 3/10.   Time 8   Period Weeks   Status On-going   PT LONG TERM GOAL #6   Title Sit 30 minutes with pain not > 3/10.   Time 8   Period Weeks   Status Partially Met  Lack of proper posture causes pain with prolonged sitting 11/07/2015  Plan - 12/17/15 1705    Clinical Impression Statement Pt was doing better today with less pain and less notable tightness in LT UT and Levator scapula. Her ROM for rotation  was about 65 degrees today and was able to perform exs for scapular strengthening and posture awareness   Pt will benefit from skilled therapeutic intervention in order to improve on the following deficits Pain;Decreased range of motion;Decreased activity tolerance   Rehab Potential Excellent   PT Frequency 2x / week   PT Duration 8 weeks   PT Treatment/Interventions Electrical Stimulation;Moist Heat;Therapeutic exercise;Therapeutic activities;Ultrasound;Patient/family education;Manual techniques   PT Next Visit Plan Continue manual therapy and modalities as needed per MPT POC.  MD note sent?   Try supine manual STW   PT Home Exercise Plan HEP- UT, Levator Scapula, mid back stretch in standing   Consulted and Agree with Plan of Care Patient        Problem List Patient Active Problem List   Diagnosis Date Noted  . BMI 40.0-44.9, adult ([place]) 07/10/2015  .  Elevated blood pressure 07/10/2015    Mir Fullilove,CHRIS, PTA 12/17/2015, 5:23 PM  [facility] [address] [place], [place], [address] Phone: [phone]   Fax:  [phone]  Name: Priscilla Beasley MRN: [id number] Date of Birth: [birth year]

## 2015-12-17 NOTE — Therapy (Deleted)
[facility] [address] [place], [place], [address] Phone: [phone]   Fax:  [phone]  Patient Details  Name: Priscilla Beasley MRN: [id number] Date of Birth: April 16, 1986 Referring Provider:  Johna SheriffVincent, Carol L, MD  Encounter Date: 12/17/2015   Priscilla Beasley,Priscilla Beasley 12/17/2015, 5:16 PM  [facility] [address] [place], [place], [address] Phone: [phone]   Fax:  [phone]

## 2015-12-19 ENCOUNTER — Ambulatory Visit: Payer: 59 | Admitting: *Deleted

## 2015-12-19 DIAGNOSIS — R519 Headache, unspecified: Secondary | ICD-10-CM

## 2015-12-19 DIAGNOSIS — R51 Headache: Secondary | ICD-10-CM | POA: Diagnosis not present

## 2015-12-19 DIAGNOSIS — M546 Pain in thoracic spine: Secondary | ICD-10-CM

## 2015-12-19 DIAGNOSIS — M542 Cervicalgia: Secondary | ICD-10-CM

## 2015-12-19 NOTE — Therapy (Signed)
[facility] [place], [place], [address] Phone: [phone]   Fax:  [phone]  Physical Therapy Treatment  Patient Details  Name: Priscilla Beasley MRN: [id number] Date of Birth: August 01, 1986 Referring Provider: Vonna Kotyk Dettinger MD  Encounter Date: 12/19/2015      PT End of Session - 12/19/15 1643    Visit Number 14   Number of Visits 16   Date for PT Re-Evaluation 12/05/15   PT Start Time 1791   PT Stop Time 5056   PT Time Calculation (min) 61 min      Past Medical History  Diagnosis Date  . Hypertension     No past surgical history on file.  There were no vitals filed for this visit.  Visit Diagnosis:  Frequent headaches  Neck pain  Bilateral thoracic back pain                                    PT Long Term Goals - 11/07/15 1720    PT LONG TERM GOAL #1   Title Ind with a HEP.   Time 8   Period Weeks   Status On-going   PT LONG TERM GOAL #2   Title Increase active cervical rotation to 80 degrees+ so patient can turn head more easily while driving.   Time 8   Period Weeks   Status On-going  AROM R cervical rotation 67 deg, L cervical rotation 69 deg 11/07/2015   PT LONG TERM GOAL #3   Title Eliminate UE symptoms.   Time 8   Period Weeks   Status Achieved   PT LONG TERM GOAL #4   Title Eliminate headaches.   Time 8   Period Weeks   Status Partially Met  Intermittant HA reported 11/07/2015   PT LONG TERM GOAL #5   Title Perform ADL's with pain not > 3/10.   Time 8   Period Weeks   Status On-going   PT LONG TERM GOAL #6   Title Sit 30 minutes with pain not > 3/10.   Time 8   Period Weeks   Status Partially Met  Lack of proper posture causes pain with prolonged sitting 11/07/2015               Plan - 12/19/15 1747    Clinical Impression Statement Pt did fairly well today with Rx. She was able to perform Therex with red Tband for postural strengthening and  given for HEP. She had normal modality responses after removal. decreased soreness in LT Utrap today, but RT cervical  paras were stilltender and tight.. She is very close to meeting ROM goal and HA goal   Pt will benefit from skilled therapeutic intervention in order to improve on the following deficits Pain;Decreased range of motion;Decreased activity tolerance   Rehab Potential Excellent   PT Frequency 2x / week   PT Duration 8 weeks   PT Treatment/Interventions Electrical Stimulation;Moist Heat;Therapeutic exercise;Therapeutic activities;Ultrasound;Patient/family education;Manual techniques   PT Next Visit Plan Continue manual therapy and modalities as needed per MPT POC.     Try supine manual STW   Dc after next week to HEP   PT Home Exercise Plan HEP- UT, Levator Scapula, mid back stretch in standing   Consulted and Agree with Plan of Care Patient        Problem List Patient Active Problem List   Diagnosis Date Noted  . BMI  40.0-44.9, adult ([place]) 07/10/2015  . Elevated blood pressure 07/10/2015    RAMSEUR,CHRIS , PTA  12/19/2015, 5:55 PM  [facility] [address] [place], [place], [address] Phone: [phone]   Fax:  [phone]  Name: Priscilla Beasley MRN: [id number] Date of Birth: July 01, 1986

## 2015-12-24 ENCOUNTER — Ambulatory Visit: Payer: 59 | Admitting: *Deleted

## 2015-12-24 DIAGNOSIS — R519 Headache, unspecified: Secondary | ICD-10-CM

## 2015-12-24 DIAGNOSIS — R51 Headache: Principal | ICD-10-CM

## 2015-12-24 DIAGNOSIS — M546 Pain in thoracic spine: Secondary | ICD-10-CM

## 2015-12-24 DIAGNOSIS — M542 Cervicalgia: Secondary | ICD-10-CM

## 2015-12-24 NOTE — Therapy (Signed)
[facility] [place], [place], [address] Phone: [phone]   Fax:  [phone]  Physical Therapy Treatment  Patient Details  Name: Priscilla Beasley MRN: [id number] Date of Birth: 02-19-1986 Referring Provider: Vonna Kotyk Dettinger MD  Encounter Date: 12/24/2015      PT End of Session - 12/24/15 1742    Visit Number 15   Number of Visits 16   PT Start Time 4166   PT Stop Time 0630   PT Time Calculation (min) 58 min      Past Medical History  Diagnosis Date  . Hypertension     No past surgical history on file.  There were no vitals filed for this visit.  Visit Diagnosis:  Frequent headaches  Neck pain  Bilateral thoracic back pain      Subjective Assessment - 12/24/15 1738    Subjective I'm doing much better, about 55% better. LT Utrap and RT side of the neck. Not as sore today. My neck was popping some over the weekend.    Limitations Sitting   How long can you sit comfortably? 15 minutes.   Patient Stated Goals Want to get back   Currently in Pain? Yes   Pain Score 3    Pain Location Neck   Pain Orientation Left;Right   Pain Descriptors / Indicators Sore   Pain Type Acute pain                         [facility] Adult PT Treatment/Exercise - 12/24/15 0001    Modalities   Modalities Electrical Stimulation;Moist Heat;Ultrasound   Moist Heat Therapy   Number Minutes Moist Heat 15 Minutes   Moist Heat Location Cervical   Electrical Stimulation   Electrical Stimulation Location RT/LT Utrap and levator premod x 15 mins 80-150hz , 5secson/off   Electrical Stimulation Goals Pain   Ultrasound   Ultrasound Location combo x 20 mins(10 mins each side) @ 1.5 w/cm2   Ultrasound Goals Pain   Manual Therapy   Manual Therapy Soft tissue mobilization;Myofascial release   Myofascial Release MFR/TPR to RT/LT  UT, Levator Scapula and RT paras in sitting to decrease pain and tighness                      PT Long Term Goals - 11/07/15 1720    PT LONG TERM GOAL #1   Title Ind with a HEP.   Time 8   Period Weeks   Status On-going   PT LONG TERM GOAL #2   Title Increase active cervical rotation to 80 degrees+ so patient can turn head more easily while driving.   Time 8   Period Weeks   Status On-going  AROM R cervical rotation 67 deg, L cervical rotation 69 deg 11/07/2015   PT LONG TERM GOAL #3   Title Eliminate UE symptoms.   Time 8   Period Weeks   Status Achieved   PT LONG TERM GOAL #4   Title Eliminate headaches.   Time 8   Period Weeks   Status Partially Met  Intermittant HA reported 11/07/2015   PT LONG TERM GOAL #5   Title Perform ADL's with pain not > 3/10.   Time 8   Period Weeks   Status On-going   PT LONG TERM GOAL #6   Title Sit 30 minutes with pain not > 3/10.   Time 8   Period Weeks   Status Partially Met  Lack of  proper posture causes pain with prolonged sitting 11/07/2015               Plan - 12/24/15 1743    Clinical Impression Statement Pt did fairly well with Rx today. She feels that she has progressed and is  50 to 55% better, but continues to have some pain at times 2-4/10 in her neck. She is doing better with her HEP and has greater posture awareness. DC after next visit        Pt will benefit from skilled therapeutic intervention in order to improve on the following deficits Pain;Decreased range of motion;Decreased activity tolerance   Rehab Potential Excellent   PT Frequency 2x / week   PT Duration 8 weeks   PT Next Visit Plan DC after next visit   PT Home Exercise Plan HEP- UT, Levator Scapula, mid back stretch in standing   Consulted and Agree with Plan of Care Patient        Problem List Patient Active Problem List   Diagnosis Date Noted  . BMI 40.0-44.9, adult ([place]) 07/10/2015  . Elevated blood pressure 07/10/2015    Marge Vandermeulen,CHRIS, PTA 12/24/2015, 5:57 PM  [facility] [place], [place], [address] Phone: [phone]   Fax:  [phone]  Name: Priscilla Beasley MRN: [id number] Date of Birth: 1986/10/05

## 2015-12-26 ENCOUNTER — Ambulatory Visit: Payer: 59 | Admitting: *Deleted

## 2015-12-26 DIAGNOSIS — R51 Headache: Secondary | ICD-10-CM | POA: Diagnosis not present

## 2015-12-26 DIAGNOSIS — M546 Pain in thoracic spine: Secondary | ICD-10-CM

## 2015-12-26 DIAGNOSIS — M542 Cervicalgia: Secondary | ICD-10-CM

## 2015-12-26 DIAGNOSIS — R519 Headache, unspecified: Secondary | ICD-10-CM

## 2015-12-26 NOTE — Therapy (Signed)
[facility] [place], [place], [address] Phone: [phone]   Fax:  [phone]  Physical Therapy Treatment  Patient Details  Name: Priscilla Beasley MRN: [id number] Date of Birth: 04/14/1986 Referring Provider: Vonna Kotyk Dettinger MD  Encounter Date: 12/26/2015      PT End of Session - 12/26/15 1752    Visit Number 16   Number of Visits 16   PT Start Time 8242   PT Stop Time 3536   PT Time Calculation (min) 59 min      Past Medical History  Diagnosis Date  . Hypertension     No past surgical history on file.  There were no vitals filed for this visit.  Visit Diagnosis:  Frequent headaches  Neck pain  Bilateral thoracic back pain                       [facility] Adult PT Treatment/Exercise - 12/26/15 0001    Exercises   Exercises Neck   Modalities   Modalities Electrical Stimulation;Moist Heat;Ultrasound   Moist Heat Therapy   Number Minutes Moist Heat 15 Minutes   Moist Heat Location Cervical   Electrical Stimulation   Electrical Stimulation Location RT/LT Utrap and levator premod x 15 mins 80-150hz , 5secson/off   Electrical Stimulation Goals Pain   Ultrasound   Ultrasound Location Combo x20 mins to RT/ LT Utrap and levator at 1.5 w/cm2   Ultrasound Goals Pain   Manual Therapy   Manual Therapy Soft tissue mobilization;Myofascial release   Myofascial Release MFR/TPR to RT/LT  UT, Levator Scapula and RT paras in sitting to decrease pain and tighness                PT Education - 12/26/15 1751    Education provided Yes   Education Details postural, Chin tucks   Person(s) Educated Patient   Methods Explanation;Demonstration;Tactile cues;Verbal cues;Handout   Comprehension Verbalized understanding             PT Long Term Goals - 12/26/15 1754    PT LONG TERM GOAL #1   Title Ind with a HEP.   Time 8   Period Weeks   Status Achieved   PT LONG TERM GOAL #2   Title Increase  active cervical rotation to 80 degrees+ so patient can turn head more easily while driving.   Period Weeks   Status Not Met  LT and RT rotation to 74 degrees   PT LONG TERM GOAL #3   Title Eliminate UE symptoms.   Time 8   Period Weeks   Status Achieved   PT LONG TERM GOAL #4   Title Eliminate headaches.   Time 8   Period Weeks   Status Achieved   PT LONG TERM GOAL #5   Title Perform ADL's with pain not > 3/10.   Time 8   Period Weeks   Status Not Met  Pain 4-5/10   PT LONG TERM GOAL #6   Title Sit 30 minutes with pain not > 3/10.   Time 8   Period Weeks   Status Achieved               Plan - 12/26/15 1753    Clinical Impression Statement Pt did very well with Rx and feels that she is about 55% better overall since starting PT. She was able to meet almost all goals except Pain for ADLs and Cervical ROM due to Pain and tightness.   Pt will  benefit from skilled therapeutic intervention in order to improve on the following deficits Pain;Decreased range of motion;Decreased activity tolerance   Rehab Potential Excellent   PT Frequency 2x / week   PT Duration 8 weeks   PT Treatment/Interventions Electrical Stimulation;Moist Heat;Therapeutic exercise;Therapeutic activities;Ultrasound;Patient/family education;Manual techniques   PT Next Visit Plan DC to HEP   PT Home Exercise Plan HEP- UT, Levator Scapula, mid back stretch in standing   Consulted and Agree with Plan of Care Patient        Problem List Patient Active Problem List   Diagnosis Date Noted  . BMI 40.0-44.9, adult ([place]) 07/10/2015  . Elevated blood pressure 07/10/2015    Denese Mentink,CHRIS, PTA 12/26/2015, 6:17 PM  [facility] [place], [place], [address] Phone: [phone]   Fax:  [phone]  Name: Annabell Oconnor MRN: [id number] Date of Birth: 1986/03/04

## 2015-12-26 NOTE — Patient Instructions (Signed)
AROM: Neck Rotation   Turn head slowly to look over one shoulder, then the other. Hold each position _10___ seconds. Repeat _5___ times per set. Do __2__ sets per session. Do _2-3___ sessions per day.  http://orth.exer.us/294   Copyright  VHI. All rights reserved.  AROM: Lateral Neck Flexion   Slowly tilt head toward one shoulder, then the other. Hold each position _10___ seconds. Repeat __5__ times per set. Do __2__ sets per session. Do __2-3__ sessions per day.  http://orth.exer.us/296   Copyright  VHI. All rights reserved.  Stretch Break - Chin Tuck   Looking straight forward, tuck chin and hold __10__ seconds. Relax and return to starting position. Repeat __5-10__ times every _3-4___ hours.  Copyright  VHI. All rights reserved.  Stretch Break - Chest and Shoulder Stretch   Maintaining erect posture, draw shoulders back while bringing elbows back and inward. Return to starting position. Repeat __10-20__ times every _3-4___ hours.  Copyright  VHI. All rights reserved.    

## 2015-12-30 NOTE — Therapy (Signed)
[facility] [place], [place], [address] Phone: [phone]   Fax:  [phone]  Physical Therapy Treatment  Patient Details  Name: Priscilla Beasley MRN: [id number] Date of Birth: Apr 05, 1986 Referring Provider: Vonna Kotyk Dettinger MD  Encounter Date: 12/26/2015    Past Medical History  Diagnosis Date  . Hypertension     No past surgical history on file.  There were no vitals filed for this visit.  Visit Diagnosis:  Frequent headaches  Neck pain  Bilateral thoracic back pain                                    PT Long Term Goals - 12/26/15 1754    PT LONG TERM GOAL #1   Title Ind with a HEP.   Time 8   Period Weeks   Status Achieved   PT LONG TERM GOAL #2   Title Increase active cervical rotation to 80 degrees+ so patient can turn head more easily while driving.   Period Weeks   Status Not Met  LT and RT rotation to 74 degrees   PT LONG TERM GOAL #3   Title Eliminate UE symptoms.   Time 8   Period Weeks   Status Achieved   PT LONG TERM GOAL #4   Title Eliminate headaches.   Time 8   Period Weeks   Status Achieved   PT LONG TERM GOAL #5   Title Perform ADL's with pain not > 3/10.   Time 8   Period Weeks   Status Not Met  Pain 4-5/10   PT LONG TERM GOAL #6   Title Sit 30 minutes with pain not > 3/10.   Time 8   Period Weeks   Status Achieved               Problem List Patient Active Problem List   Diagnosis Date Noted  . BMI 40.0-44.9, adult ([place]) 07/10/2015  . Elevated blood pressure 07/10/2015   PHYSICAL THERAPY DISCHARGE SUMMARY  Visits from Start of Care: 16  Current functional level related to goals / functional outcomes: Please see above.   Remaining deficits: Loss of cervical AROM and continue pain with performance of ADL's.   Education / Equipment: HEP.  Plan: Patient agrees to discharge.  Patient goals were not met. Patient is being  discharged due to being pleased with the current functional level.  ?????       Inez Rosato, [place] MPT 12/30/2015, 6:14 PM  [facility] [address] [place], [place], [address] Phone: [phone]   Fax:  [phone]  Name: Priscilla Beasley MRN: [id number] Date of Birth: Feb 21, 1986

## 2016-01-14 ENCOUNTER — Ambulatory Visit: Payer: 59 | Admitting: Family Medicine

## 2016-01-15 ENCOUNTER — Encounter: Payer: Self-pay | Admitting: Pediatrics

## 2016-11-06 ENCOUNTER — Encounter: Payer: Self-pay | Admitting: Pediatrics

## 2016-11-06 ENCOUNTER — Ambulatory Visit (INDEPENDENT_AMBULATORY_CARE_PROVIDER_SITE_OTHER): Payer: 59 | Admitting: Pediatrics

## 2016-11-06 VITALS — BP 166/105 | HR 115 | Temp 99.8°F | Resp 99 | Ht 68.0 in | Wt 305.6 lb

## 2016-11-06 DIAGNOSIS — R6889 Other general symptoms and signs: Secondary | ICD-10-CM

## 2016-11-06 DIAGNOSIS — J069 Acute upper respiratory infection, unspecified: Secondary | ICD-10-CM

## 2016-11-06 DIAGNOSIS — R030 Elevated blood-pressure reading, without diagnosis of hypertension: Secondary | ICD-10-CM | POA: Diagnosis not present

## 2016-11-06 LAB — VERITOR FLU A/B WAIVED
Influenza A: NEGATIVE
Influenza B: NEGATIVE

## 2016-11-06 NOTE — Progress Notes (Signed)
  Subjective:   Patient ID: Priscilla Beasley, female    DOB: 16-Nov-1985, 31 y.o.   MRN: [id number] CC: Scratchy Throat; Chest congestion; and Cough  HPI: Priscilla Beasley is a 31 y.o. female presenting for Scratchy Throat; Chest congestion; and Cough  Sick for past three days Recent travel to [place] Congested, throat scratchy, not painful Appetite has been ok Drinking plenty of fluids No fevers Taking OTC cold medications, helped some  Relevant past medical, surgical, family and social history reviewed. Allergies and medications reviewed and updated. History  Smoking Status  . Never Smoker  Smokeless Tobacco  . Never Used   ROS: Per HPI   Objective:    BP (!) 166/105   Pulse (!) 115   Temp 99.8 F (37.7 C) (Oral)   Resp (!) 99   Ht 5\' 8"  (1.727 m)   Wt (!) 305 lb 9.6 oz (138.6 kg)   SpO2 (!) 20%   BMI 46.47 kg/m   Wt Readings from Last 3 Encounters:  11/06/16 (!) 305 lb 9.6 oz (138.6 kg)  10/11/15 (!) 306 lb 6.4 oz (139 kg)  10/04/15 (!) 300 lb 6.4 oz (136.3 kg)    Gen: NAD, alert, cooperative with exam, NCAT EYES: EOMI, no conjunctival injection, or no icterus ENT:  TMs dull gray b/l, OP with mild erythema LYMPH: no cervical LAD CV: NRRR, normal S1/S2, no murmur, distal pulses 2+ b/l Resp: CTABL, no wheezes, normal WOB Abd: +BS, soft, NTND. no guarding or organomegaly Ext: No edema, warm Neuro: Alert and oriented  Assessment & Plan:  Priscilla Beasley was seen today for scratchy throat, chest congestion and cough.  Diagnoses and all orders for this visit:  Flu-like symptoms Flu negative -     Veritor Flu A/B Waived  Acute URI Discussed symptomatic care, return precautions  Elevated BP Taking OTC cold meds Come back for BP recheck in 2 weeks Check at home as well Let me know if stays elevated  Follow up plan: prn Rex Krasarol Vincent, MD [facility]

## 2016-11-06 NOTE — Patient Instructions (Signed)
Netipot with distilled water 2-3 times a day to clear out sinuses Or Normal saline nasal spray Flonase steroid nasal spray Antihistamine daily such as cetirizine Ibuprofen 600mg three times a day Lots of fluids  

## 2016-11-10 ENCOUNTER — Telehealth: Payer: Self-pay | Admitting: Pediatrics

## 2016-11-10 NOTE — Telephone Encounter (Signed)
Please contact the patient : I would be comfortable with that except that her symptoms are more like bronchitis than flu. Specifically body aches and fever are not present as I would expect with the flu. Wheezing should always be checked in the office. I understand she was just seen. But due to the change in condition she should really be reevaluated.  Thanks, Engelhard CorporationWarren Daiya Tamer M.D.

## 2016-11-10 NOTE — Telephone Encounter (Signed)
Pt tested negative for flu, she is worse since Friday. Cough, chest congestion, wheezing at times, not as bad today, sore throat. She is doing mucinex, nasal spray, tylenol. Can anything be called in

## 2016-11-10 NOTE — Telephone Encounter (Signed)
Pt notified of recommendation Will call back to schedule
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
